# Patient Record
Sex: Female | Born: 1950 | Race: White | Hispanic: No | Smoking: Former smoker
Health system: Southern US, Community
[De-identification: ages and names within clinical notes are randomized; demographics above are authoritative.]

## PROBLEM LIST (undated history)

## (undated) DIAGNOSIS — F32A Depression, unspecified: Secondary | ICD-10-CM

## (undated) DIAGNOSIS — F329 Major depressive disorder, single episode, unspecified: Secondary | ICD-10-CM

## (undated) DIAGNOSIS — Z8711 Personal history of peptic ulcer disease: Secondary | ICD-10-CM

## (undated) DIAGNOSIS — I2510 Atherosclerotic heart disease of native coronary artery without angina pectoris: Secondary | ICD-10-CM

## (undated) DIAGNOSIS — J189 Pneumonia, unspecified organism: Secondary | ICD-10-CM

## (undated) DIAGNOSIS — I10 Essential (primary) hypertension: Secondary | ICD-10-CM

## (undated) DIAGNOSIS — I82409 Acute embolism and thrombosis of unspecified deep veins of unspecified lower extremity: Secondary | ICD-10-CM

## (undated) DIAGNOSIS — G43909 Migraine, unspecified, not intractable, without status migrainosus: Secondary | ICD-10-CM

## (undated) DIAGNOSIS — I82419 Acute embolism and thrombosis of unspecified femoral vein: Secondary | ICD-10-CM

## (undated) DIAGNOSIS — E1151 Type 2 diabetes mellitus with diabetic peripheral angiopathy without gangrene: Secondary | ICD-10-CM

## (undated) DIAGNOSIS — Z8719 Personal history of other diseases of the digestive system: Secondary | ICD-10-CM

## (undated) DIAGNOSIS — E119 Type 2 diabetes mellitus without complications: Secondary | ICD-10-CM

## (undated) DIAGNOSIS — K219 Gastro-esophageal reflux disease without esophagitis: Secondary | ICD-10-CM

## (undated) DIAGNOSIS — I2699 Other pulmonary embolism without acute cor pulmonale: Secondary | ICD-10-CM

## (undated) DIAGNOSIS — J45909 Unspecified asthma, uncomplicated: Secondary | ICD-10-CM

## (undated) DIAGNOSIS — Z9981 Dependence on supplemental oxygen: Secondary | ICD-10-CM

## (undated) DIAGNOSIS — E7800 Pure hypercholesterolemia, unspecified: Secondary | ICD-10-CM

## (undated) HISTORY — PX: TOTAL ABDOMINAL HYSTERECTOMY: SHX209

## (undated) HISTORY — PX: BACK SURGERY: SHX140

## (undated) HISTORY — PX: LUMBAR DISC SURGERY: SHX700

## (undated) HISTORY — PX: TUBAL LIGATION: SHX77

## (undated) HISTORY — PX: ESOPHAGOGASTRODUODENOSCOPY (EGD) WITH ESOPHAGEAL DILATION: SHX5812

## (undated) HISTORY — PX: APPENDECTOMY: SHX54

## (undated) HISTORY — PX: LAPAROSCOPIC CHOLECYSTECTOMY: SUR755

## (undated) HISTORY — PX: DILATION AND CURETTAGE OF UTERUS: SHX78

## (undated) HISTORY — PX: TONSILLECTOMY: SUR1361

---

## 2008-09-30 HISTORY — PX: HIATAL HERNIA REPAIR: SHX195

## 2011-06-06 DIAGNOSIS — Z794 Long term (current) use of insulin: Secondary | ICD-10-CM

## 2011-06-06 DIAGNOSIS — E1149 Type 2 diabetes mellitus with other diabetic neurological complication: Secondary | ICD-10-CM | POA: Insufficient documentation

## 2011-06-06 DIAGNOSIS — E039 Hypothyroidism, unspecified: Secondary | ICD-10-CM | POA: Insufficient documentation

## 2011-06-06 DIAGNOSIS — I10 Essential (primary) hypertension: Secondary | ICD-10-CM | POA: Insufficient documentation

## 2012-07-09 DIAGNOSIS — G609 Hereditary and idiopathic neuropathy, unspecified: Secondary | ICD-10-CM | POA: Insufficient documentation

## 2012-07-22 DIAGNOSIS — M4310 Spondylolisthesis, site unspecified: Secondary | ICD-10-CM | POA: Insufficient documentation

## 2012-08-25 DIAGNOSIS — M48061 Spinal stenosis, lumbar region without neurogenic claudication: Secondary | ICD-10-CM | POA: Insufficient documentation

## 2013-02-28 DIAGNOSIS — Z86711 Personal history of pulmonary embolism: Secondary | ICD-10-CM | POA: Insufficient documentation

## 2013-03-03 DIAGNOSIS — I82502 Chronic embolism and thrombosis of unspecified deep veins of left lower extremity: Secondary | ICD-10-CM | POA: Insufficient documentation

## 2015-02-05 ENCOUNTER — Encounter (HOSPITAL_COMMUNITY): Payer: Self-pay

## 2015-02-05 ENCOUNTER — Emergency Department (HOSPITAL_COMMUNITY)
Admission: EM | Admit: 2015-02-05 | Discharge: 2015-02-05 | Disposition: A | Discharge status: Home / Self Care | Payer: BLUE CROSS/BLUE SHIELD | Source: Home / Self Care | Attending: Family Medicine | Admitting: Family Medicine

## 2015-02-05 ENCOUNTER — Emergency Department (INDEPENDENT_AMBULATORY_CARE_PROVIDER_SITE_OTHER): Payer: BLUE CROSS/BLUE SHIELD

## 2015-02-05 DIAGNOSIS — J45909 Unspecified asthma, uncomplicated: Secondary | ICD-10-CM

## 2015-02-05 MED ORDER — IPRATROPIUM BROMIDE 0.06 % NA SOLN: 2.0000 | Freq: Four times a day (QID) | NASAL | Status: DC

## 2015-02-05 MED ORDER — HYDROCOD POLST-CPM POLST ER 10-8 MG/5ML PO SUER: 5.0000 mL | Freq: Two times a day (BID) | ORAL | Status: DC | PRN

## 2015-02-05 MED ORDER — METHYLPREDNISOLONE ACETATE 40 MG/ML IJ SUSP
80.0000 mg | Freq: Once | INTRAMUSCULAR | Status: AC
  Administered 2015-02-05: 80 mg via INTRAMUSCULAR

## 2015-02-05 MED ORDER — IPRATROPIUM BROMIDE 0.02 % IN SOLN
RESPIRATORY_TRACT | Status: AC
  Filled 2015-02-05: qty 2.5

## 2015-02-05 MED ORDER — METHYLPREDNISOLONE ACETATE 80 MG/ML IJ SUSP
INTRAMUSCULAR | Status: AC
  Filled 2015-02-05: qty 1

## 2015-02-05 MED ORDER — ALBUTEROL SULFATE (2.5 MG/3ML) 0.083% IN NEBU
5.0000 mg | INHALATION_SOLUTION | Freq: Once | RESPIRATORY_TRACT | Status: AC
  Administered 2015-02-05: 5 mg via RESPIRATORY_TRACT

## 2015-02-05 MED ORDER — ALBUTEROL SULFATE (2.5 MG/3ML) 0.083% IN NEBU
INHALATION_SOLUTION | RESPIRATORY_TRACT | Status: AC
  Filled 2015-02-05: qty 6

## 2015-02-05 MED ORDER — IPRATROPIUM BROMIDE 0.02 % IN SOLN
0.5000 mg | Freq: Once | RESPIRATORY_TRACT | Status: AC
  Administered 2015-02-05: 0.5 mg via RESPIRATORY_TRACT

## 2015-02-05 NOTE — ED Provider Notes (Signed)
CSN: [id number]     Arrival date & time 02/05/15  0905 History   First MD Initiated Contact with Patient 02/05/15 [phone]     Chief Complaint  Patient presents with  . Cough   (Consider location/radiation/quality/duration/timing/severity/associated sxs/prior Treatment) Patient is a 64 y.o. female presenting with cough. The history is provided by the patient.  Cough Cough characteristics:  Productive Sputum characteristics:  Yellow Severity:  Mild Onset quality:  Gradual Duration:  2 days Progression:  Unchanged Chronicity:  New Smoker: no   Context: exposure to allergens and weather changes   Ineffective treatments:  Cough suppressants Associated symptoms: rhinorrhea, sinus congestion and wheezing   Associated symptoms: no chills, no fever, no rash and no shortness of breath     History reviewed. No pertinent past medical history. History reviewed. No pertinent past surgical history. History reviewed. No pertinent family history. History  Substance Use Topics  . Smoking status: Never Smoker   . Smokeless tobacco: Not on file  . Alcohol Use: No   OB History    No data available     Review of Systems  Constitutional: Negative.  Negative for fever and chills.  HENT: Positive for congestion, postnasal drip and rhinorrhea.   Respiratory: Positive for cough and wheezing. Negative for shortness of breath.   Skin: Negative for rash.    Allergies  Review of patient's allergies indicates no known allergies.  Home Medications   Prior to Admission medications   Medication Sig Start Date End Date Taking? Authorizing Provider  insulin aspart (NOVOLOG) 100 UNIT/ML injection Inject 14 Units into the skin once.   Yes Historical Provider, MD  pregabalin (LYRICA) 75 MG capsule Take 75 mg by mouth 2 (two) times daily.   Yes Historical Provider, MD  chlorpheniramine-HYDROcodone (TUSSIONEX PENNKINETIC ER) 10-8 MG/5ML SUER Take 5 mLs by mouth every 12 (twelve) hours as needed for cough.  02/05/15   Linna HoffJames D Kindl, MD  ipratropium (ATROVENT) 0.06 % nasal spray Place 2 sprays into both nostrils 4 (four) times daily. 02/05/15   Linna HoffJames D Kindl, MD   BP 102/65 mmHg  Pulse 99  Temp(Src) 98.3 F (36.8 C) (Oral)  Resp 18  SpO2 95% Physical Exam  Constitutional: She is oriented to person, place, and time. She appears well-developed and well-nourished.  HENT:  Mouth/Throat: Oropharynx is clear and moist.  Neck: Normal range of motion. Neck supple.  Cardiovascular: Normal rate, regular rhythm and normal heart sounds.   Pulmonary/Chest: Effort normal. She has no decreased breath sounds. She has wheezes in the left lower field. She has rales in the left lower field.    Musculoskeletal: She exhibits no edema.  Neurological: She is alert and oriented to person, place, and time.  Skin: Skin is warm and dry.  Nursing note and vitals reviewed.   ED Course  Procedures (including critical care time) Labs Review Labs Reviewed - No data to display  Imaging Review Dg Chest 2 View  02/05/2015   CLINICAL DATA:  Cough for 3 days.  Left lower lobe rales on exam.  EXAM: CHEST  2 VIEW  COMPARISON:  None.  FINDINGS: Cardiopericardial silhouette is mildly enlarged. No mediastinal or hilar masses or evidence of adenopathy.  Lateral chest radiograph mildly degraded by motion. Allowing for this, the lungs are clear. No pleural effusion or pneumothorax.  Bony thorax is intact. There has been a previous anterior cervical spine fusion.  IMPRESSION: No active cardiopulmonary disease.   Electronically Signed   By: Amie Portlandavid  Ormond  M.D.   On: 02/05/2015 10:28   X-rays reviewed and report per radiologist.   MDM   1. Asthmatic bronchitis without complication    Sx improved, lungs clear after neb.    Linna HoffJames D Kindl, MD 02/05/15 1115

## 2015-02-05 NOTE — ED Notes (Signed)
Visiting. Cough since Friday w hoarseness, yellow secretions

## 2015-07-26 DIAGNOSIS — Z7901 Long term (current) use of anticoagulants: Secondary | ICD-10-CM | POA: Insufficient documentation

## 2015-07-26 DIAGNOSIS — E785 Hyperlipidemia, unspecified: Secondary | ICD-10-CM | POA: Insufficient documentation

## 2015-08-08 DIAGNOSIS — M5126 Other intervertebral disc displacement, lumbar region: Secondary | ICD-10-CM | POA: Insufficient documentation

## 2015-10-01 DIAGNOSIS — J189 Pneumonia, unspecified organism: Secondary | ICD-10-CM

## 2015-10-01 HISTORY — PX: LUNG BIOPSY: SHX232

## 2015-10-01 HISTORY — DX: Pneumonia, unspecified organism: J18.9

## 2015-10-20 DIAGNOSIS — J9601 Acute respiratory failure with hypoxia: Secondary | ICD-10-CM | POA: Insufficient documentation

## 2015-10-20 DIAGNOSIS — F32A Depression, unspecified: Secondary | ICD-10-CM | POA: Insufficient documentation

## 2015-10-20 DIAGNOSIS — F329 Major depressive disorder, single episode, unspecified: Secondary | ICD-10-CM | POA: Insufficient documentation

## 2015-10-20 DIAGNOSIS — J811 Chronic pulmonary edema: Secondary | ICD-10-CM | POA: Insufficient documentation

## 2015-10-20 DIAGNOSIS — D509 Iron deficiency anemia, unspecified: Secondary | ICD-10-CM | POA: Insufficient documentation

## 2015-10-20 DIAGNOSIS — K219 Gastro-esophageal reflux disease without esophagitis: Secondary | ICD-10-CM | POA: Insufficient documentation

## 2015-10-20 DIAGNOSIS — F419 Anxiety disorder, unspecified: Secondary | ICD-10-CM

## 2015-12-26 DIAGNOSIS — J9601 Acute respiratory failure with hypoxia: Secondary | ICD-10-CM | POA: Insufficient documentation

## 2015-12-26 DIAGNOSIS — T402X1A Poisoning by other opioids, accidental (unintentional), initial encounter: Secondary | ICD-10-CM | POA: Insufficient documentation

## 2016-06-18 DIAGNOSIS — J84111 Idiopathic interstitial pneumonia, not otherwise specified: Secondary | ICD-10-CM | POA: Insufficient documentation

## 2016-11-05 ENCOUNTER — Ambulatory Visit (INDEPENDENT_AMBULATORY_CARE_PROVIDER_SITE_OTHER): Payer: Medicare Other | Admitting: Pulmonary Disease

## 2016-11-05 ENCOUNTER — Encounter (INDEPENDENT_AMBULATORY_CARE_PROVIDER_SITE_OTHER): Payer: Self-pay

## 2016-11-05 ENCOUNTER — Other Ambulatory Visit (INDEPENDENT_AMBULATORY_CARE_PROVIDER_SITE_OTHER): Payer: Medicare Other

## 2016-11-05 ENCOUNTER — Encounter: Payer: Self-pay | Admitting: Pulmonary Disease

## 2016-11-05 VITALS — BP 102/60 | HR 108 | Ht 60.0 in | Wt 171.8 lb

## 2016-11-05 DIAGNOSIS — R0602 Shortness of breath: Secondary | ICD-10-CM

## 2016-11-05 DIAGNOSIS — J849 Interstitial pulmonary disease, unspecified: Secondary | ICD-10-CM

## 2016-11-05 DIAGNOSIS — R0902 Hypoxemia: Secondary | ICD-10-CM | POA: Diagnosis not present

## 2016-11-05 DIAGNOSIS — G4710 Hypersomnia, unspecified: Secondary | ICD-10-CM | POA: Diagnosis not present

## 2016-11-05 LAB — CBC
HCT: 43.9 % (ref 36.0–46.0)
HEMOGLOBIN: 14.3 g/dL (ref 12.0–15.0)
MCHC: 32.5 g/dL (ref 30.0–36.0)
MCV: 84.3 fl (ref 78.0–100.0)
PLATELETS: 360 10*3/uL (ref 150.0–400.0)
RBC: 5.2 Mil/uL — AB (ref 3.87–5.11)
RDW: 16.5 % — ABNORMAL HIGH (ref 11.5–15.5)
WBC: 8.9 10*3/uL (ref 4.0–10.5)

## 2016-11-05 LAB — COMPLETE METABOLIC PANEL WITH GFR
ALK PHOS: 90 U/L (ref 33–130)
ALT: 15 U/L (ref 6–29)
AST: 16 U/L (ref 10–35)
Albumin: 3.7 g/dL (ref 3.6–5.1)
BUN: 39 mg/dL — ABNORMAL HIGH (ref 7–25)
CO2: 23 mmol/L (ref 20–31)
CREATININE: 1.18 mg/dL — AB (ref 0.50–0.99)
Calcium: 9.4 mg/dL (ref 8.6–10.4)
Chloride: 96 mmol/L — ABNORMAL LOW (ref 98–110)
GFR, EST AFRICAN AMERICAN: 56 mL/min — AB (ref >=60)
GFR, Est Non African American: 49 mL/min — ABNORMAL LOW (ref >=60)
Glucose, Bld: 430 mg/dL — ABNORMAL HIGH (ref 65–99)
Potassium: 5.2 mmol/L (ref 3.5–5.3)
Sodium: 138 mmol/L (ref 135–146)
Total Bilirubin: 0.4 mg/dL (ref 0.2–1.2)
Total Protein: 7.3 g/dL (ref 6.1–8.1)

## 2016-11-05 NOTE — Patient Instructions (Signed)
It was a pleasure taking care of you today!  Continue using your  oxygen as you have been. Continue your  current medicines for your reflux and cough.  We will do blood work today as well as chest x-ray. We will schedule you for a breathing test, blood tests, lung scan, ultrasound of your  heart, and sleep study.  Return to clinic in 6 weeks with Dr. Christene Slatese Dios

## 2016-11-05 NOTE — Progress Notes (Signed)
Subjective:    Patient ID: Peggy Todd, female    DOB: 1951-02-03, 66 y.o.   MRN: [id number]  HPI   This is the case of Peggy Todd, 66 y.o. Female, who made this appoointment for cough. She was living and getting her care at [facility] but her daughter decided to bring her to [place] where she lives.  Daughter wanted to help her mother get better.     Patient has a 20 PY smoking history, quit in her 90s. She was diagnosed with asthma, adult onset.  Triggers : pollen. She was on singulair but was discontinued. Uses proair daily. Asthma has been stable. She did NOT have any significant medical issues until 2017.   She was admitted in January 2017 for bronchitis/PNA episode.  Unusually, she ended up being discharged with 3L O2 24/7. She presented with cough, SOB, wheezing.  Symptoms of infection persisted except for the cough and SOB.   She followed up with her pulmonologist at [facility], Dr. Basilio Cairo from [facility].  Allegedly, her CXR and chest CT scan have all been (-).  Her last chest CT scan was 09/2016.  Her oxygen requirement slowly increased throughout 2017 until now.  Currently, she is on 5-8 L O2 continuously.   Her SOB also slowly increased as her O2 needs increased.   She was given Cellcept 1500 BID which she ended up taking for a year with no significant change per patient. She was also given prednisone (as high as 40 mg for several weeks) with no significant change. She has since been on Prednisone 10 mg daily.  She is currently on generic Dymista 2 squirts/nostril BID to help with cough.  She takes Protonix in am.   She ended up going to [facility] in 02/2016.  She was seen by Dr. Wynn Maudlin. She had bronchoscopy with biposy in August 2017.  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  Patent had a provoked DVT in her LLL in 2014 for which she was on blood thinner for a year.   Per pt and daughter, no  extensive cardiac work up has been done the last year.   Reviewing records from [facility], patient's initial presentation was 10/2015 for walking PNA. She had a surgical lung biopsy in March 2017 which showed nonspecific findings of honeycombing, organizing pneumonia, rare granulomas, and a lymphoplasmacytic infiltrate.  She was treated with prednisone, initially from 60 mg/d, cutting down to 20 mg/d, eventually MMF 500 mg BID was added.  Her O2 requirement increased as well as her SOB.   FVC was 49%, DLCO was 41%.  CT scan was nonspecific with patchy ground glass. ESR was 23, CRP 1.1. Serologies were negative. There was concern for microaspiration causing her pulmonary issues to get worse. Swallow study was unremarkable.    Review of Systems  Constitutional: Negative.  Negative for fever and unexpected weight change.  HENT: Positive for congestion and ear pain. Negative for dental problem, nosebleeds, postnasal drip, rhinorrhea, sinus pressure, sneezing, sore throat and trouble swallowing.   Eyes: Negative.  Negative for redness and itching.  Respiratory: Positive for cough and shortness of breath. Negative for chest tightness and wheezing.   Cardiovascular: Negative.  Negative for palpitations and leg swelling.  Gastrointestinal: Negative.  Negative for nausea and vomiting.  Endocrine: Negative.   Genitourinary: Negative.  Negative for dysuria.  Musculoskeletal: Negative.  Negative for joint swelling.  Skin: Negative.  Negative for  rash.  Allergic/Immunologic: Positive for environmental allergies.  Neurological: Negative.  Negative for headaches.  Hematological: Bruises/bleeds easily.  Psychiatric/Behavioral: Negative.  Negative for dysphoric mood. The patient is not nervous/anxious.    No past medical history on file.  No heart issues, (-) CA. Had DVT in LLE in 2014, ended up in lungs. Looks like provoked. Was on blood thinners.   No family history on file.  Mother had DM, CHF Father had CAD.    No past surgical history on file.  Had neck surgery S/P GB removal 2008 S/P hysterectomy 1994 S/P appendectomy 20 yrs ago Esophagus stretched in 2018.   Social History   Social History  . Marital status: Single    Spouse name: N/A  . Number of children: N/A  . Years of education: N/A   Occupational History  . Not on file.   Social History Main Topics  . Smoking status: Former Research scientist (life sciences)  . Smokeless tobacco: Never Used  . Alcohol use No  . Drug use: Unknown  . Sexual activity: Not on file   Other Topics Concern  . Not on file   Social History Narrative  . No narrative on file   widow, with 2 children. Lives in [place]. Daughter works in [employer]. (-) ETOH.   Allergies  Allergen Reactions  . Penicillins Swelling  . Codeine Other (See Comments)    INSOMNIA, m "makes me hyper"  . Atorvastatin Other (See Comments)    Generalized myalgia     Outpatient Medications Prior to Visit  Medication Sig Dispense Refill  . chlorpheniramine-HYDROcodone (TUSSIONEX PENNKINETIC ER) 10-8 MG/5ML SUER Take 5 mLs by mouth every 12 (twelve) hours as needed for cough. 140 mL 1  . insulin aspart (NOVOLOG) 100 UNIT/ML injection Inject 14 Units into the skin once.    Marland Kitchen ipratropium (ATROVENT) 0.06 % nasal spray Place 2 sprays into both nostrils 4 (four) times daily. 15 mL 1  . pregabalin (LYRICA) 75 MG capsule Take 75 mg by mouth 2 (two) times daily.     No facility-administered medications prior to visit.    Meds ordered this encounter  Medications  . furosemide (LASIX) 20 MG tablet    Sig: 20 mg.  . dapagliflozin propanediol (FARXIGA) 5 MG TABS tablet    Sig: Take 5 mg by mouth daily.  . Azelastine-Fluticasone 137-50 MCG/ACT SUSP    Sig: by Nasal route daily.  . simvastatin (ZOCOR) 40 MG tablet    Sig: Take 1 tablet (40 mg total) by mouth nightly.  Marland Kitchen amLODipine (NORVASC) 10 MG tablet    Sig: Take 10 mg by mouth daily.  . pioglitazone (ACTOS) 15 MG tablet    Sig: Take 15 mg by  mouth every other day.  . predniSONE (DELTASONE) 10 MG tablet    Sig: Take 10 mg by mouth daily with breakfast.  . hydrochlorothiazide (HYDRODIURIL) 25 MG tablet    Sig: Take 25 mg by mouth daily.  Marland Kitchen FLUoxetine (PROZAC) 20 MG capsule    Sig: Take 1 capsule by mouth daily.  . pregabalin (LYRICA) 75 MG capsule    Sig: Take 2 capsules by mouth 2 (two) times daily.   . insulin aspart (NOVOLOG) 100 UNIT/ML injection    Sig: Inject 4 Units into the skin 3 (three) times daily before meals. Sliding scale coverage  . insulin detemir (LEVEMIR) 100 UNIT/ML injection    Sig: Inject into the skin at bedtime.  . OXYGEN    Sig: 3 L.  Marland Kitchen albuterol (  PROAIR HFA) 108 (90 Base) MCG/ACT inhaler    Sig: Inhale 2 puffs into the lungs 4 (four) times daily as needed.        Objective:   Physical Exam Vitals:  Vitals:   11/05/16 1452  BP: 102/60  Pulse: (!) 108  SpO2: 95%  Weight: 171 lb 12.8 oz (77.9 kg)  Height: 5' (1.524 m)  On 5L on pulsed o2.   Constitutional/General:  Pleasant, well-nourished, well-developed, not in any distress,  Comfortably seating.  Well kempt  Body mass index is 33.55 kg/m. Wt Readings from Last 3 Encounters:  11/05/16 171 lb 12.8 oz (77.9 kg)    HEENT: Pupils equal and reactive to light and accommodation. Anicteric sclerae. Normal nasal mucosa.   No oral  lesions,  mouth clear,  oropharynx clear, no postnasal drip. (-) Oral thrush. No dental caries.  Airway - Mallampati class III  Neck: No masses. Midline trachea. No JVD, (-) LAD. (-) bruits appreciated.  Respiratory/Chest: Grossly normal chest. (-) deformity. (-) Accessory muscle use.  Symmetric expansion. (-) Tenderness on palpation.  Resonant on percussion.  Diminished BS on both lower lung zones. (-) wheezing, rhonchi. Scattered crackles mid-bases.  (-) egophony  Cardiovascular: Regular rate and  rhythm, heart sounds normal, no murmur or gallops, no peripheral edema  Gastrointestinal:  Normal bowel  sounds. Soft, non-tender. No hepatosplenomegaly.  (-) masses.   Musculoskeletal:  Normal muscle tone. Normal gait.   Extremities: Grossly normal. (-) clubbing, cyanosis.  (-) edema  Skin: (-) rash,lesions seen.   Neurological/Psychiatric : alert, oriented to time, place, person. Normal mood and affect         Assessment & Plan:  ILD (interstitial lung disease) ([place]) Patient is here to obtain care for her ILD as her daughter has decided to take care of her mother. Pt was getting her care from [facility] in 2017 c/o Dr. Basilio Cairo as well as from [facility] since 02/2016 c/o Dr. Wynn Maudlin. Daughter lives in [place].   No significant smoking history. She had DVT in 2013 on blood thinners x 1 year.  She had a walking PNA in 10/2015.  She was discharged on O2 3L 24/7.  Her lung infiltrates were persistent.  She had a surgical lung biopsy which showed non specific findings of honeycombing, organizing pna, rare granulomas, and a lymphoplasmacytic infiltrate.  She was started on Prednisone and Cellcept was added to be able to taper down the Prednisone.   She went to [facility] for further management.   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 Rare granulomas.  Comment: The histological changes are non-specific and may represent advanced usual interstitial pneumonia or may be a result of previous/ongiong connective tissue disease, aspiration, hypersensitivity, or infection. Clinical correlation and radiographic studies required to further characterize the process underlying this chronic interstitial fibrosis.   She followed up with both [facility] and [facility].   Pt's O2 requirement increased as well as her SOB.   Cellcept was increased to 1500 mg BID, Prednisone tapered down to 10 mg/day, and Bactrim was added as PCP prophylaxis.   Patient had a swallow evaln which was  (-). Per [facility] records, CTD work up/serologies were all (-). She was started on Pulmonary rehab.   Assessment : 1. Interstitial Lung disease A. 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 Serologies are (-). No significant exposures.   2. Aspiration component considered. EGD (08/2016) with pharyngeo-esophageal abn/dismotility.   3. DVT in 2013. Allegedly on Xarelto. CTA (-) 03/2016 for filling defects.   4. Obesity  5. Deconditioning   Plan : 1.  I think patient and daughter are in a stage of some denial as pt has slowly declined the last year.  She brought her mother to [place] to be able to take care of her.  I mentioned to pt and daughter that I will review all her records and explain to them once I have all her information. Pt was also not sure about her meds.  She was not sure whether she was taking Xarelto or Bactrim. She stopped taking cellcept as it was not making a difference after 1 yr of taking it.  2. As far as meds are concerned, I told them to continue with prednisone at 10 mg/d.  She did NOT want to resume Cellcept.  They need to determine whether she was on Xarelto and Bactrim 3.  As I anticipate will be in [place] and will have frequent admissions, will obtain CBC, CMP, baseline CXR, Will get PFT and ABG. 4. As it was not clear whether pt was taking xarelto, certainly chronic PE is a consideration but less likely.  Will obtain a VQ scan.  5. Will obtain baseline 2Decho.  No extensive cardiac work up has been done. 6.  It is also possible that she has OSA or significant hypoxemia at night.  She has hypersomnia, fatigue, snoring.  Will obtain a sleep study on her current O2 requirement.  7. Certainly, she can have microaspiration making things worse.  I told pt to take her  Protonix at HS.  8. She will need to be back on Pulmonary Rehab.  9.  I need to verify her vaccine status on f/u.  She got the influenza vaccine I think.  Not sure about PNA vaccines.        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 Currently on 5-8 L O2 [place] 24/7.  On exertion, she sometimes uses pulsed O2. Pt has a pulse oximeter.      Thank you very much for letting me participate in this patient's care. Please do not hesitate to give me a call if you have any questions or concerns regarding the treatment plan.   Patient will follow up with me in 4-6 weeks.     Monica Becton, MD 11/06/2016   4:46 AM [facility] [place] Pager: [phone] Office: [phone], Fax: [phone]

## 2016-11-06 ENCOUNTER — Telehealth: Payer: Self-pay | Admitting: Pulmonary Disease

## 2016-11-06 DIAGNOSIS — R0902 Hypoxemia: Secondary | ICD-10-CM | POA: Insufficient documentation

## 2016-11-06 DIAGNOSIS — J849 Interstitial pulmonary disease, unspecified: Secondary | ICD-10-CM | POA: Insufficient documentation

## 2016-11-06 LAB — IGG: IgG (Immunoglobin G), Serum: 1250 mg/dL (ref 694–1618)

## 2016-11-06 LAB — IGE: IGE (IMMUNOGLOBULIN E), SERUM: 3 kU/L (ref <115)

## 2016-11-06 LAB — IGM: IGM, SERUM: 63 mg/dL (ref 48–271)

## 2016-11-06 NOTE — Assessment & Plan Note (Addendum)
Patient is here to obtain care for her ILD as her daughter has decided to take care of her mother. Pt was getting [facility] in 2017 c/o Dr. Donavan Burnetouglas Lee as well as [facility] since 02/2016 c/o Dr. Marcelene [place]. Daughter lives in [place].   No significant smoking history. She had DVT in 2013 on blood thinners x 1 year.  She had a walking PNA in 10/2015.  She was discharged on O2 3L 24/7.  Her lung infiltrates were persistent.  She had a surgical lung biopsy which showed non specific findings of honeycombing, organizing pna, rare granulomas, and a lymphoplasmacytic infiltrate.  She was started on Prednisone and Cellcept was added to be able to taper down the Prednisone.   She [facility] to [facility] management.   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 Rare granulomas.  Comment: The histological changes are non-specific and may represent advanced usual interstitial pneumonia or may be a result of previous/ongiong connective tissue disease, aspiration, hypersensitivity, or infection. Clinical correlation and radiographic studies required to further characterize the process underlying this chronic interstitial fibrosis.   She [facility].   Pt's O2 requirement increased as well as her SOB.   Cellcept was increased to 1500 mg BID, Prednisone tapered down to 10 mg/day, and Bactrim was added as PCP prophylaxis.   Patient had a swallow evaln which was (-). Per [facility] records, CTD work up/serologies were all (-). She was started on Pulmonary rehab.   Assessment : 1. Interstitial Lung disease A. 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 CT scan images c/w NSIP vs chronic  HP (03/2016) C. Serologies are (-). No significant exposures.   2. Aspiration component considered. EGD (08/2016) with pharyngeo-esophageal abn/dismotility.   3. DVT in 2013. Allegedly on Xarelto. CTA (-) 03/2016 for filling defects.   4. Obesity  5. Deconditioning   Plan : 1.  I think patient and daughter are in a stage of some denial as pt has slowly declined the last year.  She brought her mother to [place] to be able to take care of her.  I mentioned to pt and daughter that I will review all her records and explain to them once I have all her information. Pt was also not sure about her meds.  She was not sure whether she was taking Xarelto or Bactrim. She stopped taking cellcept as it was not making a difference after 1 yr of taking it.  2. As far as meds are concerned, I told them to continue with prednisone at 10 mg/d.  She did NOT want to resume Cellcept.  They need to determine whether she was on Xarelto and Bactrim 3.  As I anticipate will be in [place] and will have frequent admissions, will obtain CBC, CMP, baseline CXR, Will get PFT and ABG. 4. As it was not clear whether pt was taking xarelto, certainly chronic PE is a consideration but less likely.  Will obtain a VQ scan.  5. Will obtain baseline 2Decho.  No extensive cardiac work up has been done. 6.  It is also possible that she has OSA or significant hypoxemia at night.  She has hypersomnia, fatigue, snoring.  Will obtain a sleep study on her current O2 requirement.  7. Certainly,  she can have microaspiration making things worse.  I told pt to take her Protonix at HS.  8. She will need to be back on Pulmonary Rehab.  9.  I need to verify her vaccine status on f/u.  She got the influenza vaccine I think.  Not sure about PNA vaccines.

## 2016-11-06 NOTE — Telephone Encounter (Signed)
AD  I spoke with Peggy Todd, She stated she is not taking the xarelto or bactrim. She stated she had a flu shot Aug. 2017 and she had the pna 23 but does not think she had the prevnar 13. She states she sometimes using her oxygen at night, she says she uses 4L.   Her VQ and 2D Echo is scheduled for 2/19 Her PFT and ABG and OV is 12/18/16

## 2016-11-06 NOTE — Telephone Encounter (Signed)
Pt returning call and can be reached [phone].Peggy Todd

## 2016-11-06 NOTE — Telephone Encounter (Signed)
   Jasmine :  1. Can you pls call and ask pt/daughter whether she was taking Xarelto daily as well as Bactrim daily.  She did not remember when I saw her on 2/5. pls ask also if she got Flu vaccine as well as PNA 23 vaccine and Prevnar 13 vaccine.  2.  Can we obtain images of chest [facility]? As well as other chest ct scans they have. Regular mail of CD of chest ct scan will do.  3.  Can we pls do the split night sleep study on whatever O2 she sleeps with.  I think she sleeps with 5 L o2.  Pls ask pt.  Do NOT do sleep study on RA.  4.  Can I see her as soon as VQ scan and echo and PFT/ABG are done? my earliest opening. She can even be an add on.   Thanks !  J. Alexis FrockAngelo A de Dios, MD 11/06/2016, 4:52 AM [place] Pager [phone] After 3 pm or if no answer, call [phone]

## 2016-11-06 NOTE — Assessment & Plan Note (Signed)
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 Currently on 5-8 L O2 [place] 24/7.  On exertion, she sometimes uses pulsed O2. Pt has a pulse oximeter.

## 2016-11-11 ENCOUNTER — Other Ambulatory Visit: Payer: Self-pay

## 2016-11-11 DIAGNOSIS — G4710 Hypersomnia, unspecified: Secondary | ICD-10-CM

## 2016-11-13 ENCOUNTER — Ambulatory Visit (HOSPITAL_COMMUNITY): Payer: Medicare Other

## 2016-11-13 ENCOUNTER — Encounter (HOSPITAL_COMMUNITY): Payer: Medicare Other

## 2016-11-18 ENCOUNTER — Ambulatory Visit (HOSPITAL_COMMUNITY)
Admission: RE | Admit: 2016-11-18 | Discharge: 2016-11-18 | Disposition: A | Discharge status: Home / Self Care | Payer: Medicare Other | Source: Ambulatory Visit | Attending: Pulmonary Disease | Admitting: Pulmonary Disease

## 2016-11-18 ENCOUNTER — Ambulatory Visit (HOSPITAL_BASED_OUTPATIENT_CLINIC_OR_DEPARTMENT_OTHER)
Admission: RE | Admit: 2016-11-18 | Discharge: 2016-11-18 | Disposition: A | Discharge status: Home / Self Care | Payer: Medicare Other | Source: Ambulatory Visit | Attending: Pulmonary Disease | Admitting: Pulmonary Disease

## 2016-11-18 DIAGNOSIS — E785 Hyperlipidemia, unspecified: Secondary | ICD-10-CM | POA: Diagnosis not present

## 2016-11-18 DIAGNOSIS — R918 Other nonspecific abnormal finding of lung field: Secondary | ICD-10-CM | POA: Diagnosis not present

## 2016-11-18 DIAGNOSIS — R0602 Shortness of breath: Secondary | ICD-10-CM | POA: Diagnosis present

## 2016-11-18 DIAGNOSIS — I071 Rheumatic tricuspid insufficiency: Secondary | ICD-10-CM | POA: Diagnosis not present

## 2016-11-18 DIAGNOSIS — I10 Essential (primary) hypertension: Secondary | ICD-10-CM | POA: Diagnosis not present

## 2016-11-18 MED ORDER — TECHNETIUM TC 99M DIETHYLENETRIAME-PENTAACETIC ACID
32.7000 | Freq: Once | INTRAVENOUS | Status: AC | PRN
  Administered 2016-11-18: 32.7 via RESPIRATORY_TRACT

## 2016-11-18 MED ORDER — TECHNETIUM TO 99M ALBUMIN AGGREGATED
4.4000 | Freq: Once | INTRAVENOUS | Status: AC | PRN
  Administered 2016-11-18: 4.4 via INTRAVENOUS

## 2016-11-18 NOTE — Progress Notes (Signed)
  Echocardiogram 2D Echocardiogram has been performed.  Giulia Hickey L Androw 11/18/2016, 11:47 AM

## 2016-11-21 ENCOUNTER — Telehealth: Payer: Self-pay | Admitting: Pulmonary Disease

## 2016-11-21 NOTE — Telephone Encounter (Signed)
Attempted to contact pt to give her VQ scan and 2D echo results but was unable to leave a vm

## 2016-11-22 NOTE — Telephone Encounter (Signed)
AD  Spoke with pt and gave her, She stated she is not on a blood thinner, she use to be on Xarelto. She had two questions, the first one was were you thinking about placing her back on a blood thinner and two she is still coughing what do you recommend she do?

## 2016-11-26 ENCOUNTER — Telehealth: Payer: Self-pay | Admitting: Pulmonary Disease

## 2016-11-26 MED ORDER — MONTELUKAST SODIUM 10 MG PO TABS: 10.0000 mg | ORAL_TABLET | Freq: Every day | ORAL | 3 refills | Status: DC

## 2016-11-26 NOTE — Telephone Encounter (Signed)
Spoke with pt and verified that I had the correct pharmacy. I have sent in her rx per AD. She had no further questions. Nothing further is needed at this time.

## 2016-11-26 NOTE — Telephone Encounter (Signed)
   I spoke and updated pt.  Her cough is chronic, most likely related to her ILD + GERD + Allergies.  Pt is on chronic pred at 10 mg/d. She will restart her cell cept. Cont protonic, flonase. Restart singulair.   [place] back in [place].  I advised her to f/u with her PULM and PCP in [place].    Jasmine >> pls call in Singulair 10 mg/tab 1 tab at HS #30, 3 refills.   Pollie MeyerJ. Angelo A de Dios, MD 11/26/2016, 9:03 AM [facility] Pager [phone] After 3 pm or if no answer, call [phone]

## 2016-11-26 NOTE — Telephone Encounter (Signed)
Rec;d from [facility] forward 75 pages to Dr. Delphina CahillJose Angelo A deDios

## 2016-12-12 DIAGNOSIS — I2510 Atherosclerotic heart disease of native coronary artery without angina pectoris: Secondary | ICD-10-CM | POA: Insufficient documentation

## 2016-12-18 ENCOUNTER — Ambulatory Visit: Payer: Medicare Other | Admitting: Pulmonary Disease

## 2016-12-24 ENCOUNTER — Encounter (HOSPITAL_BASED_OUTPATIENT_CLINIC_OR_DEPARTMENT_OTHER): Payer: Medicare Other

## 2016-12-29 HISTORY — PX: CORONARY ANGIOPLASTY WITH STENT PLACEMENT: SHX49

## 2017-05-06 DIAGNOSIS — M5136 Other intervertebral disc degeneration, lumbar region: Secondary | ICD-10-CM | POA: Insufficient documentation

## 2017-06-27 ENCOUNTER — Encounter: Payer: Self-pay | Admitting: Internal Medicine

## 2017-06-27 ENCOUNTER — Ambulatory Visit (INDEPENDENT_AMBULATORY_CARE_PROVIDER_SITE_OTHER): Payer: Medicare Other | Admitting: Internal Medicine

## 2017-06-27 VITALS — BP 106/60 | Ht 60.0 in | Wt 158.0 lb

## 2017-06-27 DIAGNOSIS — J849 Interstitial pulmonary disease, unspecified: Secondary | ICD-10-CM | POA: Diagnosis not present

## 2017-06-27 DIAGNOSIS — R0609 Other forms of dyspnea: Secondary | ICD-10-CM

## 2017-06-27 DIAGNOSIS — J9611 Chronic respiratory failure with hypoxia: Secondary | ICD-10-CM | POA: Diagnosis not present

## 2017-06-27 DIAGNOSIS — R05 Cough: Secondary | ICD-10-CM

## 2017-06-27 DIAGNOSIS — I10 Essential (primary) hypertension: Secondary | ICD-10-CM

## 2017-06-27 DIAGNOSIS — R058 Other specified cough: Secondary | ICD-10-CM

## 2017-06-27 MED ORDER — AMLODIPINE BESYLATE 10 MG PO TABS: ORAL_TABLET | ORAL | Status: DC

## 2017-06-27 MED ORDER — TRAMADOL HCL 50 MG PO TABS: ORAL_TABLET | ORAL | 0 refills | Status: DC

## 2017-06-27 MED ORDER — PANTOPRAZOLE SODIUM 40 MG PO TBEC: DELAYED_RELEASE_TABLET | ORAL | 2 refills | Status: DC

## 2017-06-27 NOTE — Progress Notes (Signed)
Subjective:    Patient ID: Peggy Todd, female    DOB: [birth year], 66 y.o.   MRN: [id number]  HPI  Initial eval Peggy Todd  11/05/16 This is the case of Peggy Todd, [id number] remote smoker, who made this appoointment for cough. She was living and getting her care at [facility] but her daughter decided to bring her to [place] where she lives.       Patient has a 20 PY smoking history, quit in her 45s. She was diagnosed with asthma, adult onset.  Triggers : pollen. She was on singulair but was discontinued. Uses proair daily. Asthma has been stable.    Patent had a provoked DVT in her LLL in 2013 for which she was on blood thinner for a year.   She was admitted in January 2017 for bronchitis/PNA episode. >> discharged with 3L O2 24/7.  She ended up going to [facility] in 02/2016.  She was seen by Dr. Wynn Maudlin. She had bronchoscopy with biopsy in August 2017.  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 Reviewing records from [facility], patient's initial presentation was 10/2015 for walking PNA. She had a surgical lung biopsy in March 2017 which showed nonspecific findings of honeycombing, organizing pneumonia, rare granulomas, and a lymphoplasmacytic infiltrate.  She was treated with prednisone, initially from 60 mg/d, cutting down to 20 mg/d, eventually MMF 500 mg BID was added.  Her O2 requirement increased as well as her SOB.   FVC was 49%, DLCO was 41%.  CT scan was nonspecific with patchy ground glass. ESR was 23, CRP 1.1. Serologies were negative. There was concern for microaspiration causing her pulmonary issues to get worse. Swallow study was unremarkable.  Last ov 06/20/16 Dr Randol Kern rec Use oxygen when you walk. You need up to 8 LPM with exertion. I will send a new prescription to [place]. Increase cellcept to 1500 mg twice daily (3 tablets twice). Continue prednisone. I have added an antibiotic to prevent some - but not all - infections (bactrim)  F/u 3 mon > not  done    at ov with DeDios reported  She was given Cellcept 1500 BID which she ended up taking for a year with no significant change per patient. She was also given prednisone (as high as 40 mg for several weeks) with no significant change. She has since been on Prednisone 10 mg daily rec Continue using your  oxygen as you have been. Continue your  current medicines for your reflux and cough. Echo > diastolic dysfunction gr I , no PH  11/18/16  pfts rec/ not done          06/27/2017  f/u ov/Gwynne Kemnitz re:  resp failure of ? Etiology/ multiple centers of care/ confused with instructions per DeDios note Chief Complaint  Patient presents with  . Follow-up    Former patient of Dr DeDios. She c/o increased SOB and prod cough with brown sputum.    Some better in April 2018 p stents on / never <prednisone 10 since started and back on cell cept x one month> no better protonix no timing with meals At end of admit 06/21/17 in [place]  no better p admit >  Swallowing better since EGD/ dilation 06/19/17 (but study was nl) and on protonix but not timed ac  On high dose norvasc with desats on "10 liters" x across the room  Notes brown mucus "maybe once a week"    No obvious day to day  or daytime variability or assoc   mucus plugs or hemoptysis or cp or chest tightness, subjective wheeze or overt sinus or hb symptoms. No unusual exp hx or h/o childhood pna/ asthma or knowledge of premature birth.  Sleeping ok propped up on 2 pillows  without nocturnal  or early am exacerbation  of respiratory  c/o's or need for noct saba. Also denies any obvious fluctuation of symptoms with weather or environmental changes or other aggravating or alleviating factors except as outlined above   Current Allergies, Complete Past Medical History, Past Surgical History, Family History, and Social History were reviewed in [employer] record.  ROS  The following are not active complaints unless  bolded Hoarseness, sore throat, dysphagia, dental problems, itching, sneezing,  nasal congestion or discharge of excess mucus or purulent secretions, ear ache,   fever, chills, sweats, unintended wt loss or wt gain, classically pleuritic or exertional cp,  orthopnea pnd or leg swelling, presyncope, palpitations, abdominal pain, anorexia, nausea, vomiting, diarrhea  or change in bowel habits or change in bladder habits, change in stools or change in urine, dysuria, hematuria,  rash, arthralgias, visual complaints, headache, numbness, weakness or ataxia or problems with walking or coordination,  change in mood/affect or memory.        Current Meds - not certain as to accuacy   Medication Sig  . albuterol (PROAIR HFA) 108 (90 Base) MCG/ACT inhaler Inhale 2 puffs into the lungs 4 (four) times daily as needed.  Marland Kitchen amLODipine (NORVASC) 10 MG tablet One half daily  . azelastine (ASTELIN) 0.1 % nasal spray Place 1 spray into both nostrils 2 (two) times daily. Use in each nostril as directed  . clopidogrel (PLAVIX) 75 MG tablet Take 75 mg by mouth daily.  . Cyanocobalamin (B-12 PO) Take 1 tablet by mouth daily.  . dapagliflozin propanediol (FARXIGA) 5 MG TABS tablet Take 5 mg by mouth daily.  . furosemide (LASIX) 40 MG tablet Take 40 mg by mouth daily.  . insulin detemir (LEVEMIR) 100 UNIT/ML injection Inject into the skin at bedtime.  . magnesium citrate SOLN Take 10 mLs by mouth daily as needed for severe constipation.  . mycophenolate (CELLCEPT) 500 MG tablet Take 500 mg by mouth 2 (two) times daily.  . nitroGLYCERIN (NITROSTAT) 0.4 MG SL tablet Place 0.4 mg under the tongue every 5 (five) minutes as needed for chest pain.  . OXYGEN 8-10 lpm  . pantoprazole (PROTONIX) 40 MG tablet Take 30- 60 min before your first and last meals of the day  . pioglitazone (ACTOS) 15 MG tablet Take 15 mg by mouth every other day.  . polyethylene glycol (MIRALAX / GLYCOLAX) packet Take 17 g by mouth daily.  . predniSONE  (DELTASONE) 10 MG tablet Take 10 mg by mouth daily with breakfast.  . pregabalin (LYRICA) 100 MG capsule Take 100 mg by mouth daily.  . rosuvastatin (CRESTOR) 20 MG tablet Take 20 mg by mouth daily.  . sertraline (ZOLOFT) 25 MG tablet Take 25 mg by mouth daily.  . [  amLODipine (NORVASC) 10 MG tablet Take 10 mg by mouth daily.  . [  pantoprazole (PROTONIX) 40 MG tablet Take 40 mg by mouth daily.            Objective:   Physical Exam   W/c bound hoarse wf nad   Wt Readings from Last 3 Encounters:  06/27/17 158 lb (71.7 kg)  11/05/16 171 lb 12.8 oz (77.9 kg)    Vital signs  reviewed  - - Note on arrival 02 sats  89% on 6lpm    HEENT: nl dentition, turbinates bilaterally, and oropharynx. Nl external ear canals without cough reflex   NECK :  without JVD/Nodes/TM/ nl carotid upstrokes bilaterally   LUNGS: no acc muscle use,  Nl contour chest with diffuse insp crackles but no cough on inspiration   CV:  RRR  no s3 or murmur or increase in P2, and no edema   ABD:  soft and nontender with nl inspiratory excursion in the supine position. No bruits or organomegaly appreciated, bowel sounds nl  MS:  Nl gait/ ext warm without deformities, calf tenderness, cyanosis or clubbing No obvious joint restrictions   SKIN: warm and dry without lesions    NEURO:  alert, approp, nl sensorium with  no motor or cerebellar deficits apparent.      CXR PA and Lateral:   06/27/2017 :    I personally reviewed images and agree with radiology impression as follows:   Did not go to xray  Labs ordered 06/27/2017  Did not go to lab           Assessment & Plan:

## 2017-06-27 NOTE — Patient Instructions (Addendum)
Adjust 02 to keep it > 88% as the goal   Reduce the amlodipine 10 mg to one half daily (high doses may contribute to low 02)  Protonix 40 mg Take 30- 60 min before your first and last meals of the day   GERD (REFLUX)  is an extremely common cause of respiratory symptoms just like yours , many times with no obvious heartburn at all.    It can be treated with medication, but also with lifestyle changes including elevation of the head of your bed (ideally with 6 inch  bed blocks),  Smoking cessation, avoidance of late meals, excessive alcohol, and avoid fatty foods, chocolate, peppermint, colas, red wine, and acidic juices such as orange juice.  NO MINT OR MENTHOL PRODUCTS SO NO COUGH DROPS   USE SUGARLESS CANDY INSTEAD (Jolley ranchers or Stover's or Life Savers) or even ice chips will also do - the key is to swallow to prevent all throat clearing. NO OIL BASED VITAMINS - use powdered substitutes.    Take delsym two tsp every 12 hours and supplement if needed with  tramadol 50 mg up to 1- 2 every 4 hours to suppress the urge to cough. Swallowing water or using ice chips/non mint and menthol containing candies (such as lifesavers or sugarless jolly ranchers) are also effective.  You should rest your voice and avoid activities that you know make you cough.  Once you have eliminated the cough for 3 straight days try reducing the tramadol first,  then the delsym as tolerated.    Please remember to go to the lab and x-ray department downstairs in the basement  for your tests - we will call you with the results when they are available.     Please see patient coordinator before you leave today  to schedule humidity for your 02 and an Internal medicine evaluation  See [name] NP w/in 2 weeks with all your medications, even over the counter meds, separated in two separate bags, the ones you take no matter what vs the ones you stop once you feel better and take only as needed when you feel you need them.    [name]  will generate for you a new user friendly medication calendar that will put [place] all on the same page re: your medication use.     Without this process, it simply isn't possible to assure that we are providing  your outpatient care  with  the attention to detail we feel you deserve.   If we cannot assure that you're getting that kind of care,  then we cannot manage your problem effectively from this clinic.  Once you have seen [name] and we are sure that we're all on the same page with your medication use she will arrange follow up with Dr Marchelle Gearing   Late add: did not go to xray or lab as rec

## 2017-06-28 ENCOUNTER — Encounter: Payer: Self-pay | Admitting: Internal Medicine

## 2017-06-28 DIAGNOSIS — R058 Other specified cough: Secondary | ICD-10-CM | POA: Insufficient documentation

## 2017-06-28 DIAGNOSIS — R05 Cough: Secondary | ICD-10-CM | POA: Insufficient documentation

## 2017-06-28 NOTE — Assessment & Plan Note (Signed)
Upper airway cough syndrome (previously labeled PNDS),  is so named because it's frequently impossible to sort out how much is  CR/sinusitis with freq throat clearing (which can be related to primary GERD)   vs  causing  secondary (" extra esophageal")  GERD from wide swings in gastric pressure that occur with throat clearing, often  promoting self use of mint and menthol lozenges that reduce the lower esophageal sphincter tone and exacerbate the problem further in a cyclical fashion.   These are the same pts (now being labeled as having "irritable larynx syndrome" by some cough centers) who not infrequently have a history of having failed to tolerate ace inhibitors,  dry powder inhalers or biphosphonates or report having atypical/extraesophageal reflux symptoms that don't respond to standard doses of PPI  and are easily confused as having aecopd or asthma flares by even experienced allergists/ pulmonologists (myself included).   see avs for instructions unique to this ov    I had an extended discussion with the patient reviewing all relevant studies completed to date and  lasting 25 minutes of a 40  minute office visit with extremely complex pt new to me     re  severe non-specific but potentially very serious refractory respiratory symptoms of uncertain and potentially multiple  etiologies.   Each maintenance medication was reviewed in detail including most importantly the difference between maintenance and prns and under what circumstances the prns are to be triggered using an action plan format that is not reflected in the computer generated alphabetically organized AVS.    Please see AVS for specific instructions unique to this office visit that I personally wrote and verbalized to the the pt in detail and then reviewed with pt  by my nurse highlighting any changes in therapy/plan of care  recommended at today's visit.

## 2017-06-28 NOTE — Assessment & Plan Note (Signed)
Advised to titrate to keep sats > 88%

## 2017-06-28 NOTE — Assessment & Plan Note (Signed)
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  Dec 2011 and also may have other beneficial effects as per the latest review in [place] vol 193 p1345 Jun 20016.  This may not always be cause and effect, but given how universally unimpressive and expensive  all the other  Drugs developed to day  have been for pf,   rec continue   rx ppi / diet/ lifestyle modification and f/u with serial walking sats and lung volumes for now to put more points on the curve / establish firm baseline before considering additional measures.

## 2017-06-28 NOTE — Assessment & Plan Note (Signed)
Reduce norvasc to 5 mg daily as may be interfering with hypoxic pulmonary vasocontriction and may do better on med that addresses diastolic dysfunction like verapamil or bisoprolol but will wait until returns for med reconciliatoin    To keep things simple, I have asked the patient to first separate medicines that are perceived as maintenance, that is to be taken daily "no matter what", from those medicines that are taken on only on an as-needed basis and I have given the patient examples of both, and then return to see our NP to generate a  detailed  medication calendar which should be followed until the next physician sees the patient and updates it.

## 2017-06-30 ENCOUNTER — Telehealth: Payer: Self-pay | Admitting: Internal Medicine

## 2017-06-30 NOTE — Telephone Encounter (Signed)
Patient brought FMLA paperwork with her to her appointment with MW - Fwd to [employer] via interoffice mail - 06/30/17 -pr

## 2017-07-08 ENCOUNTER — Emergency Department (HOSPITAL_COMMUNITY): Payer: Medicare Other

## 2017-07-08 ENCOUNTER — Inpatient Hospital Stay (HOSPITAL_COMMUNITY)
Admission: EM | Admit: 2017-07-08 | Discharge: 2017-07-12 | DRG: 638 | Disposition: A | Discharge status: Skilled Nursing Facility | Payer: Medicare Other | Attending: Internal Medicine | Admitting: Internal Medicine

## 2017-07-08 ENCOUNTER — Encounter (HOSPITAL_COMMUNITY): Payer: Self-pay

## 2017-07-08 DIAGNOSIS — R7309 Other abnormal glucose: Secondary | ICD-10-CM | POA: Diagnosis not present

## 2017-07-08 DIAGNOSIS — E871 Hypo-osmolality and hyponatremia: Secondary | ICD-10-CM | POA: Diagnosis present

## 2017-07-08 DIAGNOSIS — E1149 Type 2 diabetes mellitus with other diabetic neurological complication: Secondary | ICD-10-CM

## 2017-07-08 DIAGNOSIS — Z86711 Personal history of pulmonary embolism: Secondary | ICD-10-CM | POA: Diagnosis present

## 2017-07-08 DIAGNOSIS — Z7901 Long term (current) use of anticoagulants: Secondary | ICD-10-CM | POA: Diagnosis not present

## 2017-07-08 DIAGNOSIS — Z90710 Acquired absence of both cervix and uterus: Secondary | ICD-10-CM

## 2017-07-08 DIAGNOSIS — B952 Enterococcus as the cause of diseases classified elsewhere: Secondary | ICD-10-CM | POA: Diagnosis present

## 2017-07-08 DIAGNOSIS — F329 Major depressive disorder, single episode, unspecified: Secondary | ICD-10-CM | POA: Diagnosis present

## 2017-07-08 DIAGNOSIS — F419 Anxiety disorder, unspecified: Secondary | ICD-10-CM | POA: Diagnosis present

## 2017-07-08 DIAGNOSIS — Z86718 Personal history of other venous thrombosis and embolism: Secondary | ICD-10-CM

## 2017-07-08 DIAGNOSIS — Y92009 Unspecified place in unspecified non-institutional (private) residence as the place of occurrence of the external cause: Secondary | ICD-10-CM

## 2017-07-08 DIAGNOSIS — Z955 Presence of coronary angioplasty implant and graft: Secondary | ICD-10-CM

## 2017-07-08 DIAGNOSIS — Z880 Allergy status to penicillin: Secondary | ICD-10-CM

## 2017-07-08 DIAGNOSIS — J90 Pleural effusion, not elsewhere classified: Secondary | ICD-10-CM | POA: Diagnosis present

## 2017-07-08 DIAGNOSIS — J9611 Chronic respiratory failure with hypoxia: Secondary | ICD-10-CM | POA: Diagnosis not present

## 2017-07-08 DIAGNOSIS — E1140 Type 2 diabetes mellitus with diabetic neuropathy, unspecified: Secondary | ICD-10-CM | POA: Diagnosis not present

## 2017-07-08 DIAGNOSIS — R739 Hyperglycemia, unspecified: Secondary | ICD-10-CM | POA: Diagnosis present

## 2017-07-08 DIAGNOSIS — K5900 Constipation, unspecified: Secondary | ICD-10-CM | POA: Diagnosis present

## 2017-07-08 DIAGNOSIS — E785 Hyperlipidemia, unspecified: Secondary | ICD-10-CM | POA: Diagnosis present

## 2017-07-08 DIAGNOSIS — E876 Hypokalemia: Secondary | ICD-10-CM | POA: Diagnosis not present

## 2017-07-08 DIAGNOSIS — I2510 Atherosclerotic heart disease of native coronary artery without angina pectoris: Secondary | ICD-10-CM | POA: Diagnosis present

## 2017-07-08 DIAGNOSIS — K219 Gastro-esophageal reflux disease without esophagitis: Secondary | ICD-10-CM | POA: Diagnosis present

## 2017-07-08 DIAGNOSIS — E1165 Type 2 diabetes mellitus with hyperglycemia: Secondary | ICD-10-CM | POA: Diagnosis not present

## 2017-07-08 DIAGNOSIS — N390 Urinary tract infection, site not specified: Secondary | ICD-10-CM

## 2017-07-08 DIAGNOSIS — E872 Acidosis: Secondary | ICD-10-CM | POA: Diagnosis present

## 2017-07-08 DIAGNOSIS — J849 Interstitial pulmonary disease, unspecified: Secondary | ICD-10-CM | POA: Diagnosis present

## 2017-07-08 DIAGNOSIS — T380X5A Adverse effect of glucocorticoids and synthetic analogues, initial encounter: Secondary | ICD-10-CM | POA: Diagnosis present

## 2017-07-08 DIAGNOSIS — E875 Hyperkalemia: Secondary | ICD-10-CM | POA: Diagnosis present

## 2017-07-08 DIAGNOSIS — Z794 Long term (current) use of insulin: Secondary | ICD-10-CM | POA: Diagnosis not present

## 2017-07-08 DIAGNOSIS — I509 Heart failure, unspecified: Secondary | ICD-10-CM | POA: Diagnosis present

## 2017-07-08 DIAGNOSIS — Z7952 Long term (current) use of systemic steroids: Secondary | ICD-10-CM

## 2017-07-08 DIAGNOSIS — Z66 Do not resuscitate: Secondary | ICD-10-CM | POA: Diagnosis present

## 2017-07-08 DIAGNOSIS — R0602 Shortness of breath: Secondary | ICD-10-CM

## 2017-07-08 DIAGNOSIS — J449 Chronic obstructive pulmonary disease, unspecified: Secondary | ICD-10-CM | POA: Diagnosis present

## 2017-07-08 DIAGNOSIS — Z9049 Acquired absence of other specified parts of digestive tract: Secondary | ICD-10-CM

## 2017-07-08 DIAGNOSIS — Z87891 Personal history of nicotine dependence: Secondary | ICD-10-CM

## 2017-07-08 DIAGNOSIS — I10 Essential (primary) hypertension: Secondary | ICD-10-CM | POA: Diagnosis present

## 2017-07-08 DIAGNOSIS — Z981 Arthrodesis status: Secondary | ICD-10-CM

## 2017-07-08 DIAGNOSIS — B9620 Unspecified Escherichia coli [E. coli] as the cause of diseases classified elsewhere: Secondary | ICD-10-CM

## 2017-07-08 DIAGNOSIS — I110 Hypertensive heart disease with heart failure: Secondary | ICD-10-CM | POA: Diagnosis present

## 2017-07-08 HISTORY — DX: Personal history of peptic ulcer disease: Z87.11

## 2017-07-08 HISTORY — DX: Type 2 diabetes mellitus with diabetic peripheral angiopathy without gangrene: E11.51

## 2017-07-08 HISTORY — DX: Personal history of other diseases of the digestive system: Z87.19

## 2017-07-08 HISTORY — DX: Other pulmonary embolism without acute cor pulmonale: I26.99

## 2017-07-08 HISTORY — DX: Pure hypercholesterolemia, unspecified: E78.00

## 2017-07-08 HISTORY — DX: Essential (primary) hypertension: I10

## 2017-07-08 HISTORY — DX: Type 2 diabetes mellitus without complications: E11.9

## 2017-07-08 HISTORY — DX: Major depressive disorder, single episode, unspecified: F32.9

## 2017-07-08 HISTORY — DX: Dependence on supplemental oxygen: Z99.81

## 2017-07-08 HISTORY — DX: Gastro-esophageal reflux disease without esophagitis: K21.9

## 2017-07-08 HISTORY — DX: Unspecified asthma, uncomplicated: J45.909

## 2017-07-08 HISTORY — DX: Depression, unspecified: F32.A

## 2017-07-08 HISTORY — DX: Acute embolism and thrombosis of unspecified femoral vein: I82.419

## 2017-07-08 HISTORY — DX: Pneumonia, unspecified organism: J18.9

## 2017-07-08 LAB — CBG MONITORING, ED
GLUCOSE-CAPILLARY: 571 mg/dL — AB (ref 65–99)
GLUCOSE-CAPILLARY: 420 mg/dL — AB (ref 65–99)

## 2017-07-08 LAB — CBC
HEMATOCRIT: 41.9 % (ref 36.0–46.0)
HEMOGLOBIN: 14 g/dL (ref 12.0–15.0)
MCH: 29.8 pg (ref 26.0–34.0)
MCHC: 33.4 g/dL (ref 30.0–36.0)
MCV: 89.1 fL (ref 78.0–100.0)
Platelets: 177 10*3/uL (ref 150–400)
RBC: 4.7 MIL/uL (ref 3.87–5.11)
RDW: 15 % (ref 11.5–15.5)
WBC: 9.2 10*3/uL (ref 4.0–10.5)

## 2017-07-08 LAB — I-STAT VENOUS BLOOD GAS, ED
Acid-Base Excess: 21 mmol/L — ABNORMAL HIGH (ref 0.0–2.0)
Bicarbonate: 48.2 mmol/L — ABNORMAL HIGH (ref 20.0–28.0)
O2 Saturation: 86 %
PCO2 VEN: 62 mmHg — AB (ref 44.0–60.0)
PO2 VEN: 49 mmHg — AB (ref 32.0–45.0)
TCO2: >50 mmol/L — ABNORMAL HIGH (ref 22–32)
pH, Ven: 7.499 — ABNORMAL HIGH (ref 7.250–7.430)

## 2017-07-08 LAB — I-STAT CG4 LACTIC ACID, ED: LACTIC ACID, VENOUS: 3.68 mmol/L — AB (ref 0.5–1.9)

## 2017-07-08 LAB — BASIC METABOLIC PANEL
ANION GAP: 16 — AB (ref 5–15)
BUN: 21 mg/dL — ABNORMAL HIGH (ref 6–20)
CO2: 38 mmol/L — ABNORMAL HIGH (ref 22–32)
Calcium: 9.3 mg/dL (ref 8.9–10.3)
Chloride: 73 mmol/L — ABNORMAL LOW (ref 101–111)
Creatinine, Ser: 1.4 mg/dL — ABNORMAL HIGH (ref 0.44–1.00)
GFR, EST AFRICAN AMERICAN: 44 mL/min — AB (ref >60)
GFR, EST NON AFRICAN AMERICAN: 38 mL/min — AB (ref >60)
Glucose, Bld: 533 mg/dL (ref 65–99)
POTASSIUM: 2.7 mmol/L — AB (ref 3.5–5.1)
SODIUM: 127 mmol/L — AB (ref 135–145)

## 2017-07-08 MED ORDER — SODIUM CHLORIDE 0.9 % IV BOLUS (SEPSIS)
500.0000 mL | Freq: Once | INTRAVENOUS | Status: AC
  Administered 2017-07-08: 500 mL via INTRAVENOUS

## 2017-07-08 MED ORDER — POTASSIUM CHLORIDE 10 MEQ/100ML IV SOLN
10.0000 meq | Freq: Once | INTRAVENOUS | Status: AC
  Administered 2017-07-08: 10 meq via INTRAVENOUS
  Filled 2017-07-08: qty 100

## 2017-07-08 NOTE — ED Triage Notes (Signed)
Pt with hx of DM via EMS from daughter's home with hyperglycemia, generalized weakness, and ALOC. Per EMS, pt's family reports pt has been experiencing weakness and periods of confusion x 2-3 days. Pt reports nausea, dry mouth and loss of appetite. Pt reports she did not take her prescribed 8 U of novolog today at lunch but did take her evening dose. Pt A&Ox4 at this time, on 5L [place] at baseline. Denies fever, vomiting, diarrhea. EMS VS: CBG > 600 110/78, 112 HR, RR 20, 94% on 5L [place].

## 2017-07-08 NOTE — H&P (Signed)
History and Physical    Peggy Todd OZD:[id number] DOB: 1951/05/22 DOA: 07/08/2017  PCP: System, Pcp Not In  Patient coming from: home  Chief Complaint:   Feeling weak and sugar high  HPI: Peggy Todd is a 66 y.o. female with medical history significant of IDDM, HTN comes in with over a day of not feeling right, feeling weak and did not take her insulin today.  Her glucose have been over 500.  Normally she runs between 200-300 range.  No cough.  No fevers.  No n/v/d.  No abdominal pain or chest pain.  She has interstitial lung disease for which she has been on prednisone and cellcept for the last year.  She is moving here from elsewhere and in the process of establishing care with our pulmonology team.  Pt found to have a k level of 2.7 and sugar over 500.  Not acidotic.  Referred for admission for treatment of both issues.  Review of Systems: As per HPI otherwise 10 point review of systems negative.   Past Medical History:  Diagnosis Date  . Asthma   . Depression   . Diabetes mellitus without complication (HCC)   . Hypertension     Past Surgical History:  Procedure Laterality Date  . ABDOMINAL HYSTERECTOMY    . APPENDECTOMY    . BACK SURGERY    . GALLBLADDER SURGERY       reports that she has quit smoking. She has never used smokeless tobacco. She reports that she does not drink alcohol or use drugs.  Allergies  Allergen Reactions  . Penicillins Swelling  . Codeine Other (See Comments)    INSOMNIA, m "makes me hyper"  . Atorvastatin Other (See Comments)    Generalized myalgia    History reviewed. No pertinent family history.  No premature CAD  Prior to Admission medications   Medication Sig Start Date End Date Taking? Authorizing Provider  albuterol (PROAIR HFA) 108 (90 Base) MCG/ACT inhaler Inhale 2 puffs into the lungs 4 (four) times daily as needed. 10/30/13  Yes [provider]  amLODipine (NORVASC) 10 MG tablet One half daily Patient taking differently:  Take 10 mg by mouth daily. One half daily 06/27/17  Yes Nyoka Cowden, MD  clopidogrel (PLAVIX) 75 MG tablet Take 75 mg by mouth daily.   Yes [provider]  Cyanocobalamin (B-12 PO) Take 1 tablet by mouth daily.   Yes [provider]  furosemide (LASIX) 40 MG tablet Take 40 mg by mouth daily.   Yes [provider]  insulin aspart (NOVOLOG) 100 UNIT/ML injection Inject 100 Units into the skin 3 (three) times daily before meals.   Yes [provider]  magnesium citrate SOLN Take 10 mLs by mouth daily as needed for severe constipation.   Yes [provider]  mycophenolate (CELLCEPT) 500 MG tablet Take 1,000 mg by mouth 2 (two) times daily.    Yes [provider]  nitroGLYCERIN (NITROSTAT) 0.4 MG SL tablet Place 0.4 mg under the tongue every 5 (five) minutes as needed for chest pain.   Yes [provider]  OXYGEN 8-10 lpm   Yes [provider]  pioglitazone (ACTOS) 15 MG tablet Take 15 mg by mouth every other day.   Yes [provider]  polyethylene glycol (MIRALAX / GLYCOLAX) packet Take 17 g by mouth daily.   Yes [provider]  pregabalin (LYRICA) 100 MG capsule Take 100 mg by mouth 2 (two) times daily.    Yes [provider]  rosuvastatin (CRESTOR) 20 MG tablet Take 20 mg by mouth daily.   Yes [provider]  sertraline (ZOLOFT) 25 MG tablet Take 25 mg by mouth daily.   Yes [provider]  traMADol (ULTRAM) 50 MG tablet 1-2 every 4 hours as needed for cough or pain Patient taking differently: Take 50-100 mg by mouth daily.  06/27/17  Yes Nyoka Cowden, MD  pantoprazole (PROTONIX) 40 MG tablet Take 30- 60 min before your first and last meals of the day Patient not taking: Reported on 07/08/2017 06/27/17   Nyoka Cowden, MD    Physical Exam: Vitals:   07/08/17 2100 07/08/17 2200 07/08/17 2230 07/08/17 2300  BP: 124/76 119/79 120/80 127/83  Pulse: (!) 108     Resp: (!) 34  (!) 27 19 (!) 22  Temp:      TempSrc:      SpO2: 90%   94%  Weight:      Height:        Constitutional: NAD, calm, comfortable Vitals:   07/08/17 2100 07/08/17 2200 07/08/17 2230 07/08/17 2300  BP: 124/76 119/79 120/80 127/83  Pulse: (!) 108     Resp: (!) 34 (!) 27 19 (!) 22  Temp:      TempSrc:      SpO2: 90%   94%  Weight:      Height:       Eyes: PERRL, lids and conjunctivae normal ENMT: Mucous membranes are moist. Posterior pharynx clear of any exudate or lesions.Normal dentition.  Neck: normal, supple, no masses, no thyromegaly Respiratory: clear to auscultation bilaterally, no wheezing, no crackles. Normal respiratory effort. No accessory muscle use.  Cardiovascular: Regular rate and rhythm, no murmurs / rubs / gallops. No extremity edema. 2+ pedal pulses. No carotid bruits.  Abdomen: no tenderness, no masses palpated. No hepatosplenomegaly. Bowel sounds positive.  Musculoskeletal: no clubbing / cyanosis. No joint deformity upper and lower extremities. Good ROM, no contractures. Normal muscle tone.  Skin: no rashes, lesions, ulcers. No induration Neurologic: CN 2-12 grossly intact. Sensation intact, DTR normal. Strength 5/5 in all 4.  Psychiatric: Normal judgment and insight. Alert and oriented x 3. Normal mood.    Labs on Admission: I have personally reviewed following labs and imaging studies  CBC:  Recent Labs Lab 07/08/17 2028  WBC 9.2  HGB 14.0  HCT 41.9  MCV 89.1  PLT 177   Basic Metabolic Panel:  Recent Labs Lab 07/08/17 2028  NA 127*  K 2.7*  CL 73*  CO2 38*  GLUCOSE 533*  BUN 21*  CREATININE 1.40*  CALCIUM 9.3   GFR: Estimated Creatinine Clearance: 34.9 mL/min (A) (by C-G formula based on SCr of 1.4 mg/dL (H)).  CBG:  Recent Labs Lab 07/08/17 2029 07/08/17 2305  GLUCAP 571* 420*    Radiological Exams on Admission: Dg Chest Portable 1 View  Result Date: 07/08/2017 CLINICAL DATA:  Acute onset of mid chest tightness, dyspnea and  dry cough. Initial encounter. EXAM: PORTABLE CHEST 1 VIEW COMPARISON:  Chest radiograph performed 11/18/2016 FINDINGS: The lungs are hypoexpanded. Vascular congestion is noted. Bilateral central airspace opacities may reflect atypical pneumonia or pulmonary edema. Would correlate with the chronicity of the patient's symptoms, as this is similar in appearance to the prior study from February. No definite pleural effusion or pneumothorax is seen. The cardiomediastinal silhouette is borderline normal in size. No acute osseous abnormalities are identified. Cervical spinal fusion hardware is partially imaged. Clips are noted within the  right upper quadrant, reflecting prior cholecystectomy. IMPRESSION: Lungs hypoexpanded. Vascular congestion. Bilateral central airspace opacities are similar in appearance to the prior study from February, and may reflect atypical pneumonia or pulmonary edema. Would correlate with the chronicity of the patient's symptoms, to help exclude underlying chronic infectious or inflammatory process. Electronically Signed   By: Roanna Raider M.D.   On: 07/08/2017 21:28    Old chart reviewed Case discussed with edp   Assessment/Plan 67 yo female with uncontrolled dm and hypokalemia  Principal Problem:   Hypokalemia- replete with iv kcl and thru ivf overnight.  Ck mag level.  Ck 12 lead ekg.  Tele monitor in ED shows nothing significant.  Repeat level in the am  Active Problems:   Anxiety and depression- stable   History of pulmonary embolism- noted, not on anything chronically per med list   Essential hypertension- stalbe   Idiopathic interstitial disease (HCC)- stable cont cellcept and prednisone per her report   Type 2 diabetes mellitus with neurologic complication, with long-term current use of insulin (HCC)- place on ssi, only takes 8 units with meals and no long acting   Chronic respiratory failure with hypoxia (HCC)- stable, on 2 liters at home   Hyperglycemia- as above,  replete k first before giving insulin       DVT prophylaxis:  scds Code Status:  Full code Family Communication:  none Disposition Plan:  Per day team Consults called:  none Admission status:  observation   Sequan Auxier A MD [facility]  If 7PM-7AM, please contact night-coverage [email or web address] Password [email or web address]  07/08/2017, 11:38 PM

## 2017-07-08 NOTE — ED Provider Notes (Signed)
[facility]EMER[facility]ENCY DEPT Provider Note   CSN: [id number] Arrival date & time: 07/08/17  2022     History   Chief Complaint Chief Complaint  Patient presents with  . Hyperglycemia  . Altered Mental Status    HPI Peggy Todd is a 66 y.o. female.  HPI Patient presents with hyperglycemia and generalized weakness. Has a history of diabetes. Also has some interstitial fibrosis. Is on CellCept and some prednisone. Has been on less prednisone recently however. Has had occasional cough. Breathing is not really different than normal. Has not had increased urination. States she felt bad and did not have her insulin at lunch today. Sugar 500 upon arrival. No fevers. Past Medical History:  Diagnosis Date  . Asthma   . Depression   . Diabetes mellitus without complication (HCC)   . Hypertension     Patient Active Problem List   Diagnosis Date Noted  . Upper airway cough syndrome 06/28/2017  . DOE (dyspnea on exertion) 06/27/2017  . Chronic respiratory failure with hypoxia (HCC) 06/27/2017  . ILD (interstitial lung disease) (HCC) 11/06/2016  . Hypoxemia 11/06/2016  . 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 Acute respiratory failure with hypoxia (HCC) 12/26/2015  . Opioid overdose (HCC) 12/26/2015  . Acute hypoxemic respiratory failure (HCC) 10/20/2015  . Anxiety and depression 10/20/2015  . Gastroesophageal reflux disease without esophagitis 10/20/2015  . Iron deficiency anemia 10/20/2015  . Pulmonary edema 10/20/2015  . Herniated nucleus pulposus, L4-5 left 08/08/2015  . Chronic anticoagulation 07/26/2015  . Dyslipidemia 07/26/2015  . Leg DVT (deep venous thromboembolism), chronic, left (HCC) 03/03/2013  . History of pulmonary embolism 02/28/2013  . Spinal stenosis of lumbar region without neurogenic claudication 08/25/2012  . Essential hypertension 06/06/2011  . Hypothyroidism 06/06/2011  . Type 2 diabetes mellitus with neurologic complication, with long-term current  use of insulin (HCC) 06/06/2011    Past Surgical History:  Procedure Laterality Date  . ABDOMINAL HYSTERECTOMY    . APPENDECTOMY    . BACK SURGERY    . GALLBLADDER SURGERY      OB History    No data available       Home Medications    Prior to Admission medications   Medication Sig Start Date End Date Taking? Authorizing Provider  albuterol (PROAIR HFA) 108 (90 Base) MCG/ACT inhaler Inhale 2 puffs into the lungs 4 (four) times daily as needed. 10/30/13   [provider]  amLODipine (NORVASC) 10 MG tablet One half daily 06/27/17   Nyoka Cowden, MD  azelastine (ASTELIN) 0.1 % nasal spray Place 1 spray into both nostrils 2 (two) times daily. Use in each nostril as directed    [provider]  clopidogrel (PLAVIX) 75 MG tablet Take 75 mg by mouth daily.    [provider]  Cyanocobalamin (B-12 PO) Take 1 tablet by mouth daily.    [provider]  dapagliflozin propanediol (FARXIGA) 5 MG TABS tablet Take 5 mg by mouth daily.    [provider]  furosemide (LASIX) 40 MG tablet Take 40 mg by mouth daily.    [provider]  insulin detemir (LEVEMIR) 100 UNIT/ML injection Inject into the skin at bedtime.    [provider]  magnesium citrate SOLN Take 10 mLs by mouth daily as needed for severe constipation.    [provider]  mycophenolate (CELLCEPT) 500 MG tablet Take 500 mg by mouth 2 (two) times daily.    [provider]  nitroGLYCERIN (NITROSTAT) 0.4 MG SL tablet Place  0.4 mg under the tongue every 5 (five) minutes as needed for chest pain.    [provider]  OXYGEN 8-10 lpm    [provider]  pantoprazole (PROTONIX) 40 MG tablet Take 30- 60 min before your first and last meals of the day 06/27/17   Nyoka Cowden, MD  pioglitazone (ACTOS) 15 MG tablet Take 15 mg by mouth every other day.    [provider]  polyethylene glycol (MIRALAX / GLYCOLAX) packet Take 17 g by mouth  daily.    [provider]  predniSONE (DELTASONE) 10 MG tablet Take 10 mg by mouth daily with breakfast.    [provider]  pregabalin (LYRICA) 100 MG capsule Take 100 mg by mouth daily.    [provider]  rosuvastatin (CRESTOR) 20 MG tablet Take 20 mg by mouth daily.    [provider]  sertraline (ZOLOFT) 25 MG tablet Take 25 mg by mouth daily.    [provider]  traMADol [name]) 50 MG tablet 1-2 every 4 hours as needed for cough or pain 06/27/17   Nyoka Cowden, MD    Family History History reviewed. No pertinent family history.  Social History Social History  Substance Use Topics  . Smoking status: Former Games developer  . Smokeless tobacco: Never Used  . Alcohol use No     Allergies   Penicillins; Codeine; and Atorvastatin   Review of Systems Review of Systems  Constitutional: Positive for appetite change and fatigue.  HENT: Negative for congestion.   Respiratory: Positive for cough and shortness of breath.   Cardiovascular: Negative for chest pain.  Gastrointestinal: Negative for abdominal pain.  Genitourinary: Negative for dysuria and flank pain.  Musculoskeletal: Negative for back pain.  Skin: Negative for rash.  Neurological: Negative for tremors and numbness.  Psychiatric/Behavioral: Positive for confusion.     Physical Exam Updated Vital Signs BP 135/79 (BP Location: Left Arm)   Pulse (!) 106   Temp 97.6 F (36.4 C) (Oral)   Resp (!) 32   Ht 5' (1.524 m)   Wt 71.7 kg (158 lb)   SpO2 95%   BMI 30.86 kg/m   Physical Exam  Constitutional: She appears well-developed.  HENT:  Head: Normocephalic.  Mucous membranes are dry  Eyes: Pupils are equal, round, and reactive to light.  Neck: Neck supple.  Cardiovascular: Normal rate.   Pulmonary/Chest:  Mildly harsh breath sounds throughout patient is on nasal cannula oxygen.  Abdominal: Soft.  Musculoskeletal: She exhibits no edema.  Neurological: She is alert.    Skin: Skin is warm. Capillary refill takes less than 2 seconds.  Psychiatric: She has a normal mood and affect.     ED Treatments / Results  Labs (all labs ordered are listed, but only abnormal results are displayed) Labs Reviewed  BASIC METABOLIC PANEL - Abnormal; Notable for the following:       Result Value   Sodium 127 (*)    Potassium 2.7 (*)    Chloride 73 (*)    CO2 38 (*)    Glucose, Bld 533 (*)    BUN 21 (*)    Creatinine, Ser 1.40 (*)    GFR calc non Af Amer 38 (*)    GFR calc Af Amer 44 (*)    Anion gap 16 (*)    All other components within normal limits  CBG MONITORING, ED - Abnormal; Notable for the following:    Glucose-Capillary 571 (*)    All other  components within normal limits  I-STAT CG4 LACTIC ACID, ED - Abnormal; Notable for the following:    Lactic Acid, Venous 3.68 (*)    All other components within normal limits  I-STAT VENOUS BLOOD GAS, ED - Abnormal; Notable for the following:    pH, Ven 7.499 (*)    pCO2, Ven 62.0 (*)    pO2, Ven 49.0 (*)    Bicarbonate 48.2 (*)    TCO2 >50 (*)    Acid-Base Excess 21.0 (*)    All other components within normal limits  CBC  URINALYSIS, ROUTINE W REFLEX MICROSCOPIC    EKG  EKG Interpretation None       Radiology Dg Chest Portable 1 View  Result Date: 07/08/2017 CLINICAL DATA:  Acute onset of mid chest tightness, dyspnea and dry cough. Initial encounter. EXAM: PORTABLE CHEST 1 VIEW COMPARISON:  Chest radiograph performed 11/18/2016 FINDINGS: The lungs are hypoexpanded. Vascular congestion is noted. Bilateral central airspace opacities may reflect atypical pneumonia or pulmonary edema. Would correlate with the chronicity of the patient's symptoms, as this is similar in appearance to the prior study from February. No definite pleural effusion or pneumothorax is seen. The cardiomediastinal silhouette is borderline normal in size. No acute osseous abnormalities are identified. Cervical spinal fusion hardware is  partially imaged. Clips are noted within the right upper quadrant, reflecting prior cholecystectomy. IMPRESSION: Lungs hypoexpanded. Vascular congestion. Bilateral central airspace opacities are similar in appearance to the prior study from February, and may reflect atypical pneumonia or pulmonary edema. Would correlate with the chronicity of the patient's symptoms, to help exclude underlying chronic infectious or inflammatory process. Electronically Signed   By: Roanna Raider M.D.   On: 07/08/2017 21:28    Procedures Procedures (including critical care time)  Medications Ordered in ED Medications  sodium chloride 0.9 % bolus 500 mL (500 mLs Intravenous New Bag/Given 07/08/17 2104)  potassium chloride 10 mEq in 100 mL IVPB (not administered)     Initial Impression / Assessment and Plan / ED Course  I have reviewed the triage vital signs and the nursing notes.  Pertinent labs & imaging results that were available during my care of the patient were reviewed by me and considered in my medical decision making (see chart for details).     Patient has chronic lung disease. Chronic oxygen. Found to have hyperglycemia. Does have an anion gap but is also alkalotic. Likely is chronically alkalotic due to her respiratory status. Also hypokalemic. X-ray shows some chronic changes. Has had no real change in her breathing and normal white count without fever. I think with her hypokalemia and tenuous respiratory status patient benefit from admission for management of hyperglycemia and hypokalemia.  Final Clinical Impressions(s) / ED Diagnoses   Final diagnoses:  Hyperglycemia  Hypokalemia    New Prescriptions New Prescriptions   No medications on file     Benjiman Core, MD 07/08/17 2149

## 2017-07-09 ENCOUNTER — Ambulatory Visit: Payer: Medicare Other | Admitting: Family Medicine

## 2017-07-09 ENCOUNTER — Encounter (HOSPITAL_COMMUNITY): Payer: Self-pay | Admitting: General Practice

## 2017-07-09 ENCOUNTER — Telehealth: Payer: Self-pay | Admitting: Internal Medicine

## 2017-07-09 DIAGNOSIS — Z66 Do not resuscitate: Secondary | ICD-10-CM | POA: Diagnosis present

## 2017-07-09 DIAGNOSIS — I10 Essential (primary) hypertension: Secondary | ICD-10-CM

## 2017-07-09 DIAGNOSIS — F419 Anxiety disorder, unspecified: Secondary | ICD-10-CM

## 2017-07-09 DIAGNOSIS — E785 Hyperlipidemia, unspecified: Secondary | ICD-10-CM | POA: Diagnosis present

## 2017-07-09 DIAGNOSIS — E872 Acidosis: Secondary | ICD-10-CM | POA: Diagnosis present

## 2017-07-09 DIAGNOSIS — Z794 Long term (current) use of insulin: Secondary | ICD-10-CM | POA: Diagnosis not present

## 2017-07-09 DIAGNOSIS — J9611 Chronic respiratory failure with hypoxia: Secondary | ICD-10-CM | POA: Diagnosis present

## 2017-07-09 DIAGNOSIS — J449 Chronic obstructive pulmonary disease, unspecified: Secondary | ICD-10-CM | POA: Diagnosis present

## 2017-07-09 DIAGNOSIS — E1140 Type 2 diabetes mellitus with diabetic neuropathy, unspecified: Secondary | ICD-10-CM | POA: Diagnosis not present

## 2017-07-09 DIAGNOSIS — Y92009 Unspecified place in unspecified non-institutional (private) residence as the place of occurrence of the external cause: Secondary | ICD-10-CM | POA: Diagnosis not present

## 2017-07-09 DIAGNOSIS — J849 Interstitial pulmonary disease, unspecified: Secondary | ICD-10-CM | POA: Diagnosis present

## 2017-07-09 DIAGNOSIS — Z86718 Personal history of other venous thrombosis and embolism: Secondary | ICD-10-CM | POA: Diagnosis not present

## 2017-07-09 DIAGNOSIS — Z7952 Long term (current) use of systemic steroids: Secondary | ICD-10-CM | POA: Diagnosis not present

## 2017-07-09 DIAGNOSIS — Z7901 Long term (current) use of anticoagulants: Secondary | ICD-10-CM | POA: Diagnosis not present

## 2017-07-09 DIAGNOSIS — E1165 Type 2 diabetes mellitus with hyperglycemia: Secondary | ICD-10-CM | POA: Diagnosis present

## 2017-07-09 DIAGNOSIS — Z86711 Personal history of pulmonary embolism: Secondary | ICD-10-CM | POA: Diagnosis not present

## 2017-07-09 DIAGNOSIS — F329 Major depressive disorder, single episode, unspecified: Secondary | ICD-10-CM

## 2017-07-09 DIAGNOSIS — K219 Gastro-esophageal reflux disease without esophagitis: Secondary | ICD-10-CM

## 2017-07-09 DIAGNOSIS — N390 Urinary tract infection, site not specified: Secondary | ICD-10-CM | POA: Diagnosis present

## 2017-07-09 DIAGNOSIS — B9620 Unspecified Escherichia coli [E. coli] as the cause of diseases classified elsewhere: Secondary | ICD-10-CM | POA: Diagnosis present

## 2017-07-09 DIAGNOSIS — T380X5A Adverse effect of glucocorticoids and synthetic analogues, initial encounter: Secondary | ICD-10-CM | POA: Diagnosis present

## 2017-07-09 DIAGNOSIS — Z955 Presence of coronary angioplasty implant and graft: Secondary | ICD-10-CM | POA: Diagnosis not present

## 2017-07-09 DIAGNOSIS — R739 Hyperglycemia, unspecified: Secondary | ICD-10-CM | POA: Diagnosis not present

## 2017-07-09 DIAGNOSIS — R0602 Shortness of breath: Secondary | ICD-10-CM | POA: Diagnosis not present

## 2017-07-09 DIAGNOSIS — I2510 Atherosclerotic heart disease of native coronary artery without angina pectoris: Secondary | ICD-10-CM | POA: Diagnosis present

## 2017-07-09 DIAGNOSIS — E875 Hyperkalemia: Secondary | ICD-10-CM | POA: Diagnosis present

## 2017-07-09 DIAGNOSIS — E871 Hypo-osmolality and hyponatremia: Secondary | ICD-10-CM | POA: Diagnosis present

## 2017-07-09 DIAGNOSIS — E876 Hypokalemia: Secondary | ICD-10-CM | POA: Diagnosis present

## 2017-07-09 DIAGNOSIS — J90 Pleural effusion, not elsewhere classified: Secondary | ICD-10-CM | POA: Diagnosis present

## 2017-07-09 LAB — BASIC METABOLIC PANEL
ANION GAP: 14 (ref 5–15)
Anion gap: 14 (ref 5–15)
BUN: 17 mg/dL (ref 6–20)
BUN: 19 mg/dL (ref 6–20)
CALCIUM: 8.8 mg/dL — AB (ref 8.9–10.3)
CALCIUM: 8.9 mg/dL (ref 8.9–10.3)
CO2: 39 mmol/L — ABNORMAL HIGH (ref 22–32)
CO2: 39 mmol/L — ABNORMAL HIGH (ref 22–32)
CREATININE: 1.22 mg/dL — AB (ref 0.44–1.00)
Chloride: 79 mmol/L — ABNORMAL LOW (ref 101–111)
Chloride: 80 mmol/L — ABNORMAL LOW (ref 101–111)
Creatinine, Ser: 1.22 mg/dL — ABNORMAL HIGH (ref 0.44–1.00)
GFR calc non Af Amer: 45 mL/min — ABNORMAL LOW (ref >60)
GFR, EST AFRICAN AMERICAN: 52 mL/min — AB (ref >60)
GFR, EST AFRICAN AMERICAN: 52 mL/min — AB (ref >60)
GFR, EST NON AFRICAN AMERICAN: 45 mL/min — AB (ref >60)
Glucose, Bld: 398 mg/dL — ABNORMAL HIGH (ref 65–99)
Glucose, Bld: 359 mg/dL — ABNORMAL HIGH (ref 65–99)
POTASSIUM: 2.5 mmol/L — AB (ref 3.5–5.1)
Potassium: 2.4 mmol/L — CL (ref 3.5–5.1)
SODIUM: 132 mmol/L — AB (ref 135–145)
Sodium: 133 mmol/L — ABNORMAL LOW (ref 135–145)

## 2017-07-09 LAB — CBC
HCT: 37.2 % (ref 36.0–46.0)
HEMOGLOBIN: 12.3 g/dL (ref 12.0–15.0)
MCH: 29.6 pg (ref 26.0–34.0)
MCHC: 33.1 g/dL (ref 30.0–36.0)
MCV: 89.4 fL (ref 78.0–100.0)
Platelets: 176 10*3/uL (ref 150–400)
RBC: 4.16 MIL/uL (ref 3.87–5.11)
RDW: 14.9 % (ref 11.5–15.5)
WBC: 7.9 10*3/uL (ref 4.0–10.5)

## 2017-07-09 LAB — URINALYSIS, ROUTINE W REFLEX MICROSCOPIC
BILIRUBIN URINE: NEGATIVE
Glucose, UA: >=500 mg/dL — AB
Hgb urine dipstick: NEGATIVE
KETONES UR: 20 mg/dL — AB
Leukocytes, UA: NEGATIVE
Nitrite: NEGATIVE
PROTEIN: 30 mg/dL — AB
Specific Gravity, Urine: 1.013 (ref 1.005–1.030)
pH: 6 (ref 5.0–8.0)

## 2017-07-09 LAB — GLUCOSE, CAPILLARY
GLUCOSE-CAPILLARY: 347 mg/dL — AB (ref 65–99)
GLUCOSE-CAPILLARY: 327 mg/dL — AB (ref 65–99)
GLUCOSE-CAPILLARY: 211 mg/dL — AB (ref 65–99)
GLUCOSE-CAPILLARY: 235 mg/dL — AB (ref 65–99)

## 2017-07-09 LAB — HEMOGLOBIN A1C
HEMOGLOBIN A1C: 9.3 % — AB (ref 4.8–5.6)
Mean Plasma Glucose: 220.21 mg/dL

## 2017-07-09 LAB — MAGNESIUM: Magnesium: 2 mg/dL (ref 1.7–2.4)

## 2017-07-09 LAB — LACTIC ACID, PLASMA
LACTIC ACID, VENOUS: 1.8 mmol/L (ref 0.5–1.9)
LACTIC ACID, VENOUS: 1.1 mmol/L (ref 0.5–1.9)

## 2017-07-09 MED ORDER — POTASSIUM CHLORIDE 20 MEQ/15ML (10%) PO SOLN
40.0000 meq | Freq: Once | ORAL | Status: AC
  Administered 2017-07-09: 40 meq via ORAL
  Filled 2017-07-09: qty 30

## 2017-07-09 MED ORDER — AMLODIPINE BESYLATE 10 MG PO TABS
10.0000 mg | ORAL_TABLET | Freq: Every day | ORAL | Status: DC
  Administered 2017-07-09 – 2017-07-12 (×4): 10 mg via ORAL
  Filled 2017-07-09 (×4): qty 1

## 2017-07-09 MED ORDER — BENZOCAINE 10 % MT GEL
Freq: Four times a day (QID) | OROMUCOSAL | Status: DC | PRN
  Filled 2017-07-09: qty 9

## 2017-07-09 MED ORDER — ROSUVASTATIN CALCIUM 20 MG PO TABS
20.0000 mg | ORAL_TABLET | Freq: Every day | ORAL | Status: DC
  Administered 2017-07-09 – 2017-07-12 (×4): 20 mg via ORAL
  Filled 2017-07-09 (×4): qty 1

## 2017-07-09 MED ORDER — ALBUTEROL SULFATE (2.5 MG/3ML) 0.083% IN NEBU: 2.5000 mg | INHALATION_SOLUTION | Freq: Four times a day (QID) | RESPIRATORY_TRACT | Status: DC | PRN

## 2017-07-09 MED ORDER — PREGABALIN 100 MG PO CAPS
100.0000 mg | ORAL_CAPSULE | Freq: Two times a day (BID) | ORAL | Status: DC
  Administered 2017-07-09 – 2017-07-12 (×8): 100 mg via ORAL
  Filled 2017-07-09 (×8): qty 1

## 2017-07-09 MED ORDER — MYCOPHENOLATE MOFETIL 250 MG PO CAPS
1000.0000 mg | ORAL_CAPSULE | Freq: Two times a day (BID) | ORAL | Status: DC
  Administered 2017-07-09 (×2): 1000 mg via ORAL
  Filled 2017-07-09 (×2): qty 4

## 2017-07-09 MED ORDER — INSULIN ASPART 100 UNIT/ML ~~LOC~~ SOLN
0.0000 [IU] | Freq: Three times a day (TID) | SUBCUTANEOUS | Status: DC
  Administered 2017-07-09 (×2): 7 [IU] via SUBCUTANEOUS

## 2017-07-09 MED ORDER — NITROGLYCERIN 0.4 MG SL SUBL: 0.4000 mg | SUBLINGUAL_TABLET | SUBLINGUAL | Status: DC | PRN

## 2017-07-09 MED ORDER — FAMOTIDINE 20 MG PO TABS
20.0000 mg | ORAL_TABLET | Freq: Every day | ORAL | Status: DC
  Administered 2017-07-09 – 2017-07-11 (×3): 20 mg via ORAL
  Filled 2017-07-09 (×3): qty 1

## 2017-07-09 MED ORDER — SERTRALINE HCL 50 MG PO TABS
25.0000 mg | ORAL_TABLET | Freq: Every day | ORAL | Status: DC
  Administered 2017-07-09 – 2017-07-12 (×4): 25 mg via ORAL
  Filled 2017-07-09 (×4): qty 1

## 2017-07-09 MED ORDER — IPRATROPIUM-ALBUTEROL 0.5-2.5 (3) MG/3ML IN SOLN
3.0000 mL | Freq: Four times a day (QID) | RESPIRATORY_TRACT | Status: DC
  Administered 2017-07-09: 3 mL via RESPIRATORY_TRACT
  Filled 2017-07-09 (×2): qty 3

## 2017-07-09 MED ORDER — CLOPIDOGREL BISULFATE 75 MG PO TABS
75.0000 mg | ORAL_TABLET | Freq: Every day | ORAL | Status: DC
  Administered 2017-07-09 – 2017-07-12 (×4): 75 mg via ORAL
  Filled 2017-07-09 (×4): qty 1

## 2017-07-09 MED ORDER — INSULIN ASPART 100 UNIT/ML ~~LOC~~ SOLN
0.0000 [IU] | Freq: Three times a day (TID) | SUBCUTANEOUS | Status: DC
  Administered 2017-07-09 – 2017-07-10 (×2): 3 [IU] via SUBCUTANEOUS
  Administered 2017-07-10: 7 [IU] via SUBCUTANEOUS
  Administered 2017-07-10: 9 [IU] via SUBCUTANEOUS
  Administered 2017-07-11: 1 [IU] via SUBCUTANEOUS

## 2017-07-09 MED ORDER — MYCOPHENOLATE MOFETIL 250 MG PO CAPS
500.0000 mg | ORAL_CAPSULE | Freq: Two times a day (BID) | ORAL | Status: DC
  Administered 2017-07-09 – 2017-07-12 (×6): 500 mg via ORAL
  Filled 2017-07-09 (×6): qty 2

## 2017-07-09 MED ORDER — SODIUM CHLORIDE 0.9 % IV SOLN: INTRAVENOUS | Status: DC

## 2017-07-09 MED ORDER — INSULIN ASPART 100 UNIT/ML ~~LOC~~ SOLN: 4.0000 [IU] | Freq: Three times a day (TID) | SUBCUTANEOUS | Status: DC

## 2017-07-09 MED ORDER — POTASSIUM CHLORIDE IN NACL 40-0.9 MEQ/L-% IV SOLN
INTRAVENOUS | Status: AC
  Administered 2017-07-09 (×2): 125 mL/h via INTRAVENOUS
  Filled 2017-07-09 (×2): qty 1000

## 2017-07-09 MED ORDER — POTASSIUM CHLORIDE 10 MEQ/100ML IV SOLN
10.0000 meq | INTRAVENOUS | Status: AC
  Administered 2017-07-09 (×4): 10 meq via INTRAVENOUS
  Filled 2017-07-09 (×4): qty 100

## 2017-07-09 MED ORDER — INSULIN ASPART 100 UNIT/ML ~~LOC~~ SOLN
4.0000 [IU] | Freq: Three times a day (TID) | SUBCUTANEOUS | Status: DC
  Administered 2017-07-09 – 2017-07-12 (×9): 4 [IU] via SUBCUTANEOUS

## 2017-07-09 MED ORDER — PREDNISONE 10 MG PO TABS
10.0000 mg | ORAL_TABLET | Freq: Every day | ORAL | Status: DC
  Administered 2017-07-09 – 2017-07-12 (×4): 10 mg via ORAL
  Filled 2017-07-09 (×4): qty 1

## 2017-07-09 MED ORDER — INSULIN ASPART 100 UNIT/ML ~~LOC~~ SOLN
0.0000 [IU] | Freq: Every day | SUBCUTANEOUS | Status: DC
  Administered 2017-07-09: 2 [IU] via SUBCUTANEOUS
  Administered 2017-07-10: 5 [IU] via SUBCUTANEOUS

## 2017-07-09 MED ORDER — PANTOPRAZOLE SODIUM 40 MG PO TBEC: 40.0000 mg | DELAYED_RELEASE_TABLET | Freq: Every day | ORAL | Status: DC

## 2017-07-09 MED ORDER — INSULIN DETEMIR 100 UNIT/ML ~~LOC~~ SOLN
18.0000 [IU] | Freq: Every day | SUBCUTANEOUS | Status: DC
  Administered 2017-07-09 – 2017-07-10 (×2): 18 [IU] via SUBCUTANEOUS
  Filled 2017-07-09 (×2): qty 0.18

## 2017-07-09 NOTE — Telephone Encounter (Signed)
Rec'd FMLA paperwork via interoffice mail from [employer] - fwd to MW's nurse for MW to complete -pr

## 2017-07-09 NOTE — Telephone Encounter (Signed)
MW as last MD that seen patient, Lanora Manis at [facility] In[facility] pharmacy needs to know what dosing of Cellcept is correct for patient. She stated patient told them she is taking  BID Our records show 1000 mg BID  And Lanora Manis noted in last OV note here it mentioned  BID.   Please advise STAT as they need a response within 30-45 minutes. Thanks.

## 2017-07-09 NOTE — Telephone Encounter (Signed)
Given to Dr. Sherene Sires to fill out and sign. Will return to [place] once it is completed.

## 2017-07-09 NOTE — Progress Notes (Signed)
Arrival Method: Patient arrived in stretcher from ED. Mental Orientation: alert and oriented Telemetry:22 Assessment: See Doc Flow sheets. Skin: Warm, dry and intact. IV: Peripheral right hand Pain:.denies Fall Prevention Safety Plan: Patient educated about fall prevention safety plan, understood and acknowledged. Admission Screening:6700 Orientation: Patient has been oriented to the unit, staff and to the room.

## 2017-07-09 NOTE — Progress Notes (Addendum)
Inpatient Diabetes Program Recommendations  AACE/ADA: New Consensus Statement on Inpatient Glycemic Control (2015)  Target Ranges:  Prepandial:   less than 140 mg/dL      Peak postprandial:   less than 180 mg/dL (1-2 hours)      Critically ill patients:  140 - 180 mg/dL   Results for Peggy Todd, Peggy Todd (MRN [id number]) as of 07/09/2017 09:55  Ref. Range 07/08/2017 20:29 07/08/2017 23:05 07/09/2017 07:48  Glucose-Capillary Latest Ref Range: 65 - 99 mg/dL 401 (HH) 027 (H) 253 (H)   Results for Peggy Todd, Peggy Todd (MRN [id number]) as of 07/09/2017 09:55  Ref. Range 07/09/2017 09:00  Hemoglobin A1C Latest Ref Range: 4.8 - 5.6 % 9.3 (H)   Review of Glycemic Control  Diabetes history: DM2 Outpatient Diabetes medications: Novolog 8 units TID with meals Current orders for Inpatient glycemic control: Novolog 0-9 units TID with meals  Inpatient Diabetes Program Recommendations: Insulin - Basal: Please consider ordering Levemir 18 units Q24H starting now (based on 71 kg x 0.25 units). Correction (SSI): Please consider ordering Novolog 0-5 units QHS for bedtime correction. Insulin - Meal Coverage: If post prandial glucose is consistently elevated, may need to order Novolog meal coverage as well. HgbA1C: A1C 9.3% on 07/09/17 indicates an average glucose 220 mg/dl.  NOTE: Spoke with patient about diabetes and home regimen for diabetes control. Patient reports that she is now living in [place] with her daughter as she recently moved from [place], [place]. Patient was being followed by PCP for diabetes management but patient will be establishing care with new PCP located here in [place]. Patient reports that she already has an appointment set up with a local doctor for this coming Monday (07/14/17) but she is not sure of the name of the doctor. Patient reports that the only DM medication she is taking is Novolog 8 units TID with meals.  Noted Actos on home medication list but patient states that she is not taking  Actos as her doctor stopped it. Noted in Care Everywhere that patient was recently inpatient at [facility] from 06/06/17 to 06/21/17 and patient was discharged on Levemir 15 units with breakfast, Levemir 10 units with supper, Humalog 7 units TID with meals, plus Humalog 0-14 units TID per correction scale for DM control. Inquired about Levemir and patient reports that she was taking Levemir as an outpatient but when she was discharged from [facility] she was told to stop taking Levemir. Explained to patient that per information noted in Care Everywhere, she should have been taking Levemir 15 units QAM with breakfast and Levemir 10 units QPM with supper. Patient stated that no one informed her of that; therefore, misunderstanding regarding Levemir. Inquired about Humalog versus Novolog she reports taking and patient confirms that she is taking Novolog and has Novolog insulin pens at home. Patient no longer has any Levemir insulin pens at home and will need Levemir insulin pens prescribed at discharge if prescribed. Patient states that she checks her glucose 2-3 times per day and it has been running high since she was discharged from [facility]. Patient states that she needs a prescription for test strips because she does not have many test strips left.  Inquired about prior A1C and patient reports that her last A1C was high.   Discussed glucose and A1C goals. Discussed importance of checking CBGs and maintaining good CBG control to prevent long-term and short-term complications. Explained how hyperglycemia leads to damage within blood vessels which lead to the common complications  seen with uncontrolled diabetes. Stressed to the patient the importance of improving glycemic control to prevent further complications from uncontrolled diabetes. Informed patient that she will likely need to resume Levemir at discharge and that a request for Levemir as an inpatient will be made today.  Patient verbalized understanding of information discussed and she states that she has no further questions at this time related to diabetes.   Thanks, Orlando Penner, RN, MSN, CDE Diabetes Coordinator Inpatient Diabetes Program [phone] (Team Pager)

## 2017-07-09 NOTE — Care Management Obs Status (Signed)
MEDICARE OBSERVATION STATUS NOTIFICATION   Patient Details  Name: Peggy Todd MRN: [id number] Date of Birth: 1951-05-01   Medicare Observation Status Notification Given:  Yes    Lawerance Sabal, RN 07/09/2017, 3:52 PM

## 2017-07-09 NOTE — Telephone Encounter (Signed)
She told us 500 mg bid so I did not change it - this was recommended by [facility], not here

## 2017-07-09 NOTE — Progress Notes (Signed)
Dr. Marland Mcalpine notified pt reported code status as DNR during admission assessment completed by Darl Pikes, RN. Pt reiterated to this RN in the presence of her daughter, that she does not want any life sustaining measures to be taken and requests code status to be DNR. MD to change code status.

## 2017-07-09 NOTE — Telephone Encounter (Signed)
Spoke with Lanora Manis. She is aware of MW's recs. Nothing else needed at time of call.

## 2017-07-09 NOTE — Progress Notes (Addendum)
Dr. Marland Mcalpine notified of course crackles auscultated throughout; inquired about continuous IVF's. Verbal order to hold fluids for now and re-evaluate tomorrow after repeat CXR. Pt updated. Will continue to monitor.

## 2017-07-09 NOTE — Progress Notes (Signed)
PROGRESS NOTE    Peggy Todd  ZOX:[id number] DOB: 03/20/1951 DOA: 07/08/2017 PCP: System, Pcp Not In   Brief Narrative:  Peggy Todd is a 66 y.o. female with medical history significant of IDDM, HTN and other comorbidities who comes in with over a day of not feeling right, feeling weak and did not take her insulin today.  Her glucose has been over 500.  Normally she runs between 200-300 range.  No cough. No fevers.  No n/v/d.  No abdominal pain or chest pain.  She has interstitial lung disease for which she has been on prednisone and cellcept for the last year. She is moving here from elsewhere and in the process of establishing care with our Pulmonology team.  Pt found to have a k level of 2.7 and sugar over 500.  Not acidotic.  Referred for admission for treatment of both issues. Currently doing slightly better. Will get PT Evaluation.   Assessment & Plan:   Principal Problem:   Hypokalemia Active Problems:   ILD (interstitial lung disease) (HCC)   Anxiety and depression   Chronic anticoagulation   Gastroesophageal reflux disease without esophagitis   History of pulmonary embolism   Essential hypertension   Type 2 diabetes mellitus with neurologic complication, with long-term current use of insulin (HCC)   Chronic respiratory failure with hypoxia (HCC)   Hyperglycemia  Hypokalemia -Patient's K+ was 2.5 this AM -Replete with IV KCl 40 mEQ, po 40 mEQ, and with NS at 125 mL/hr + 40 mEQ of KCl x 12 hours -Magnesium Level was 2.0 -Continue to Monitor and Replete as Necessary -Repeat CMP and EKG in AM   Uncontrolled Hyperglycemia in the Setting of Insulin Dependent Diabetes Mellitus Type 2 -HbA1c was 9.3 -Likely 2/2 Chronic Steroid Use for Idiopathic Interstitial Lung Disease -Diabetes Education Coordinator Consulted -Started Patient on 18 units sq Daily and Sensitive Novolog SSI AC/HS -Continue to Monitor CBG's; CBG's ranging from 327-420 -Will Add 4 units Novolog TID wm; Per  report patient takes 8 units premeal and no long acting -Hold Home Pioglitazone  -Continue to Monitor as patient is on Chronic Steroids  Generalized Weakness -C/w IVF with NS at 75 mL/hr -Urinalysis showed Many Bacteria and patient states she has had some mild burning. Check a Urine Cx -Obtain PT/OT Evaluation   Essential HTN -C/w Home Amlodipine  Hyperlipidemia -C/w Home Rosuvastatin 20 mg po Daily   COPD -Hold Home Albuterol and place on Albuterol Nebs  CAD s/p RCA with 4 Stents placed -C/w NTG -C/w Clopidogrel 75 mg po Daily and Rosuvastatin 20 mg po Daily  GERD -Was previously on Pantoprazole 40 mg po Daily but has an interaction with Cellcept -Will start Famotidine 20 mg po qHS  Depression and Anxiety -C/w Sertraline 25 mg po Daily  History of Lower Leg DVT and Submassive Pulmonary embolism in [facility] -No longer on Anticoagulation with Xarelto  Idiopathic interstitial disease (HCC) -C/w Mycophenolate 500 mg po BID Daily and with Prednisone 10 mg po Daily  Chronic respiratory failure with hypoxia (HCC)  -Wears 8-10 liters at Home per Chart Review  -Per Pulm note Maintain O2 Saturations >88% -Add Prn Albuterol Nebs -CXR showed Lungs hypoexpanded. Vascular congestion. Bilateral central airspace opacities are similar in appearance to the prior study from February, and may reflect atypical pneumonia or pulmonary edema. Would correlate with the chronicity of the patient's symptoms, to help exclude underlying chronic infectious or inflammatory process. -Repeat CXR in AM and consider CT Chest   Hyponatremia -Mild at  133 -Continue to Monitor and Repeat CMP as patient was just rehydrated  Lactic Acidosis -Improved with IVF -Lactic went from 3.68 -> 1.1  DVT prophylaxis: SCDs Code Status: FULL CODE Family Communication: No family present at bedside  Disposition Plan: Pending further work up and PT Evaluation   Consultants:   Diabetes Education Coordinator    Procedures:   None  Antimicrobials:  Anti-infectives    None     Subjective: Seen and examined and stated she felt better and was not confused. Was alert and oriented. No CP. States she is chronically SOB. No N/V. No other concerns or complaints at this time.   Objective: Vitals:   07/09/17 0033 07/09/17 0437 07/09/17 1026 07/09/17 1720  BP: 139/65 127/72 105/77 116/61  Pulse: 84 94 96 88  Resp: Temp: 98.3 F (36.8 C) 98.3 F (36.8 C) 98.1 F (36.7 C) 97.9 F (36.6 C)  TempSrc: Oral Oral Oral Oral  SpO2: 97% 98% 98% 99%  Weight: 71.7 kg (158 lb 1.1 oz)     Height: 5' (1.524 m)       Intake/Output Summary (Last 24 hours) at 07/09/17 1720 Last data filed at 07/09/17 1635  Gross per 24 hour  Intake           1377.5 ml  Output                0 ml  Net           1377.5 ml   Filed Weights   07/08/17 2026 07/09/17 0033  Weight: 71.7 kg (158 lb) 71.7 kg (158 lb 1.1 oz)   Examination: Physical Exam:  Constitutional: WN/WD Caucasian female in NAD and appears calm and comfortable Eyes: Lids and conjunctivae normal, sclerae anicteric  ENMT: External Ears, Nose appear normal. Grossly normal hearing. Mucous membranes are moist.  Neck: Appears normal, supple, no cervical masses, normal ROM, no appreciable thyromegaly. No JVD Respiratory: Diminished to auscultation bilaterally with mild rales. No wheezing, rhonchi or crackles. Normal respiratory effort and patient is not tachypenic. No accessory muscle use. Wearing Supplemental O2 via [place].  Cardiovascular: RRR, no murmurs / rubs / gallops. S1 and S2 auscultated. No extremity edema.   Abdomen: Soft, non-tender, non-distended. No masses palpated. No appreciable hepatosplenomegaly. Bowel sounds positive.  GU: Deferred. Musculoskeletal: No clubbing / cyanosis of digits/nails. No joint deformity upper and lower extremities. Good ROM, no contractures Skin: No rashes, lesions, ulcers on a limited skin eval. No induration;  Warm and dry.  Neurologic: CN 2-12 grossly intact with no focal deficits. Sensation intact in all 4 Extremities, DTR normal. Strength 5/5 in all 4. Romberg sign cerebellar reflexes not assessed.  Psychiatric: Normal judgment and insight. Alert and oriented x 3. Normal mood and appropriate affect.   Data Reviewed: I have personally reviewed following labs and imaging studies  CBC:  Recent Labs Lab 07/08/17 2028 07/09/17 0310  WBC 9.2 7.9  HGB 14.0 12.3  HCT 41.9 37.2  MCV 89.1 89.4  PLT 177 176   Basic Metabolic Panel:  Recent Labs Lab 07/08/17 2028 07/09/17 0003 07/09/17 0310  NA 127* 132* 133*  K 2.7* 2.4* 2.5*  CL 73* 79* 80*  CO2 38* 39* 39*  GLUCOSE 533* 398* 359*  BUN 21* 19 17  CREATININE 1.40* 1.22* 1.22*  CALCIUM 9.3 8.9 8.8*  MG  --  2.0  --    GFR: Estimated Creatinine Clearance: 40.1 mL/min (A) (by C-G formula based on SCr  of 1.22 mg/dL (H)). Liver Function Tests: No results for input(s): AST, ALT, ALKPHOS, BILITOT, PROT, ALBUMIN in the last 168 hours. No results for input(s): LIPASE, AMYLASE in the last 168 hours. No results for input(s): AMMONIA in the last 168 hours. Coagulation Profile: No results for input(s): INR, PROTIME in the last 168 hours. Cardiac Enzymes: No results for input(s): CKTOTAL, CKMB, CKMBINDEX, TROPONINI in the last 168 hours. BNP (last 3 results) No results for input(s): PROBNP in the last 8760 hours. HbA1C:  Recent Labs  07/09/17 0900  HGBA1C 9.3*   CBG:  Recent Labs Lab 07/08/17 2029 07/08/17 2305 07/09/17 0748 07/09/17 1156  GLUCAP 571* 420* 347* 327*   Lipid Profile: No results for input(s): CHOL, HDL, LDLCALC, TRIG, CHOLHDL, LDLDIRECT in the last 72 hours. Thyroid Function Tests: No results for input(s): TSH, T4TOTAL, FREET4, T3FREE, THYROIDAB in the last 72 hours. Anemia Panel: No results for input(s): VITAMINB12, FOLATE, FERRITIN, TIBC, IRON, RETICCTPCT in the last 72 hours. Sepsis Labs:  Recent  Labs Lab 07/08/17 2038 07/09/17 0114 07/09/17 0310  LATICACIDVEN 3.68* 1.8 1.1    No results found for this or any previous visit (from the past 240 hour(s)).   Radiology Studies: Dg Chest Portable 1 View  Result Date: 07/08/2017 CLINICAL DATA:  Acute onset of mid chest tightness, dyspnea and dry cough. Initial encounter. EXAM: PORTABLE CHEST 1 VIEW COMPARISON:  Chest radiograph performed 11/18/2016 FINDINGS: The lungs are hypoexpanded. Vascular congestion is noted. Bilateral central airspace opacities may reflect atypical pneumonia or pulmonary edema. Would correlate with the chronicity of the patient's symptoms, as this is similar in appearance to the prior study from February. No definite pleural effusion or pneumothorax is seen. The cardiomediastinal silhouette is borderline normal in size. No acute osseous abnormalities are identified. Cervical spinal fusion hardware is partially imaged. Clips are noted within the right upper quadrant, reflecting prior cholecystectomy. IMPRESSION: Lungs hypoexpanded. Vascular congestion. Bilateral central airspace opacities are similar in appearance to the prior study from February, and may reflect atypical pneumonia or pulmonary edema. Would correlate with the chronicity of the patient's symptoms, to help exclude underlying chronic infectious or inflammatory process. Electronically Signed   By: Roanna Raider M.D.   On: 07/08/2017 21:28   Scheduled Meds: . amLODipine  10 mg Oral Daily  . clopidogrel  75 mg Oral Daily  . famotidine  20 mg Oral QHS  . insulin aspart  0-5 Units Subcutaneous QHS  . insulin aspart  0-9 Units Subcutaneous TID WC  . [START ON 07/10/2017] insulin aspart  4 Units Subcutaneous TID WC  . insulin detemir  18 Units Subcutaneous Daily  . mycophenolate  500 mg Oral BID  . predniSONE  10 mg Oral Q breakfast  . pregabalin  100 mg Oral BID  . rosuvastatin  20 mg Oral Daily  . sertraline  25 mg Oral Daily   Continuous Infusions: .  sodium chloride      LOS: 0 days   Merlene Laughter, DO [facility] Pager [phone]  If 7PM-7AM, please contact night-coverage [email or web address] Password [email or web address] 07/09/2017, 5:20 PM

## 2017-07-10 ENCOUNTER — Inpatient Hospital Stay (HOSPITAL_COMMUNITY): Payer: Medicare Other

## 2017-07-10 DIAGNOSIS — E875 Hyperkalemia: Secondary | ICD-10-CM

## 2017-07-10 DIAGNOSIS — B952 Enterococcus as the cause of diseases classified elsewhere: Secondary | ICD-10-CM

## 2017-07-10 DIAGNOSIS — B9620 Unspecified Escherichia coli [E. coli] as the cause of diseases classified elsewhere: Secondary | ICD-10-CM

## 2017-07-10 DIAGNOSIS — N390 Urinary tract infection, site not specified: Secondary | ICD-10-CM

## 2017-07-10 DIAGNOSIS — R0602 Shortness of breath: Secondary | ICD-10-CM

## 2017-07-10 LAB — COMPREHENSIVE METABOLIC PANEL
ALT: 28 U/L (ref 14–54)
ANION GAP: 7 (ref 5–15)
AST: 23 U/L (ref 15–41)
Albumin: 2.4 g/dL — ABNORMAL LOW (ref 3.5–5.0)
Alkaline Phosphatase: 167 U/L — ABNORMAL HIGH (ref 38–126)
BILIRUBIN TOTAL: 0.5 mg/dL (ref 0.3–1.2)
BUN: 10 mg/dL (ref 6–20)
CO2: 33 mmol/L — ABNORMAL HIGH (ref 22–32)
Calcium: 9.2 mg/dL (ref 8.9–10.3)
Chloride: 98 mmol/L — ABNORMAL LOW (ref 101–111)
Creatinine, Ser: 0.93 mg/dL (ref 0.44–1.00)
GFR calc Af Amer: >60 mL/min (ref >60)
Glucose, Bld: 240 mg/dL — ABNORMAL HIGH (ref 65–99)
Potassium: 5.5 mmol/L — ABNORMAL HIGH (ref 3.5–5.1)
Sodium: 138 mmol/L (ref 135–145)
TOTAL PROTEIN: 5.2 g/dL — AB (ref 6.5–8.1)

## 2017-07-10 LAB — CBC WITH DIFFERENTIAL/PLATELET
BASOS ABS: 0 10*3/uL (ref 0.0–0.1)
BASOS PCT: 0 %
EOS ABS: 0.1 10*3/uL (ref 0.0–0.7)
EOS PCT: 1 %
HEMATOCRIT: 37.1 % (ref 36.0–46.0)
Hemoglobin: 11.7 g/dL — ABNORMAL LOW (ref 12.0–15.0)
LYMPHS ABS: 1.7 10*3/uL (ref 0.7–4.0)
Lymphocytes Relative: 27 %
MCH: 29.2 pg (ref 26.0–34.0)
MCHC: 31.5 g/dL (ref 30.0–36.0)
MCV: 92.5 fL (ref 78.0–100.0)
Monocytes Absolute: 0.7 10*3/uL (ref 0.1–1.0)
Monocytes Relative: 11 %
NEUTROS PCT: 61 %
Neutro Abs: 3.8 10*3/uL (ref 1.7–7.7)
Platelets: 179 10*3/uL (ref 150–400)
RBC: 4.01 MIL/uL (ref 3.87–5.11)
RDW: 16.3 % — AB (ref 11.5–15.5)
WBC: 6.2 10*3/uL (ref 4.0–10.5)

## 2017-07-10 LAB — BASIC METABOLIC PANEL
Anion gap: 12 (ref 5–15)
BUN: 10 mg/dL (ref 6–20)
CALCIUM: 8.8 mg/dL — AB (ref 8.9–10.3)
CO2: 30 mmol/L (ref 22–32)
CREATININE: 1.3 mg/dL — AB (ref 0.44–1.00)
Chloride: 93 mmol/L — ABNORMAL LOW (ref 101–111)
GFR calc non Af Amer: 42 mL/min — ABNORMAL LOW (ref >60)
GFR, EST AFRICAN AMERICAN: 48 mL/min — AB (ref >60)
Glucose, Bld: 467 mg/dL — ABNORMAL HIGH (ref 65–99)
Potassium: 4.1 mmol/L (ref 3.5–5.1)
SODIUM: 135 mmol/L (ref 135–145)

## 2017-07-10 LAB — GLUCOSE, CAPILLARY
GLUCOSE-CAPILLARY: 244 mg/dL — AB (ref 65–99)
GLUCOSE-CAPILLARY: 438 mg/dL — AB (ref 65–99)
Glucose-Capillary: 373 mg/dL — ABNORMAL HIGH (ref 65–99)
Glucose-Capillary: 485 mg/dL — ABNORMAL HIGH (ref 65–99)

## 2017-07-10 LAB — MAGNESIUM: MAGNESIUM: 2 mg/dL (ref 1.7–2.4)

## 2017-07-10 LAB — PHOSPHORUS: Phosphorus: 1.5 mg/dL — ABNORMAL LOW (ref 2.5–4.6)

## 2017-07-10 MED ORDER — INSULIN DETEMIR 100 UNIT/ML ~~LOC~~ SOLN
7.0000 [IU] | SUBCUTANEOUS | Status: AC
  Administered 2017-07-10: 7 [IU] via SUBCUTANEOUS
  Filled 2017-07-10: qty 0.07

## 2017-07-10 MED ORDER — BENZONATATE 100 MG PO CAPS
100.0000 mg | ORAL_CAPSULE | Freq: Two times a day (BID) | ORAL | Status: DC | PRN
  Administered 2017-07-10 – 2017-07-12 (×3): 100 mg via ORAL
  Filled 2017-07-10 (×4): qty 1

## 2017-07-10 MED ORDER — LEVOFLOXACIN IN D5W 500 MG/100ML IV SOLN
500.0000 mg | INTRAVENOUS | Status: DC
  Filled 2017-07-10: qty 100

## 2017-07-10 MED ORDER — IPRATROPIUM-ALBUTEROL 0.5-2.5 (3) MG/3ML IN SOLN
3.0000 mL | Freq: Three times a day (TID) | RESPIRATORY_TRACT | Status: DC
  Administered 2017-07-10 – 2017-07-11 (×4): 3 mL via RESPIRATORY_TRACT
  Filled 2017-07-10 (×4): qty 3

## 2017-07-10 MED ORDER — DEXTROSE 5 % IV SOLN
30.0000 mmol | Freq: Once | INTRAVENOUS | Status: AC
  Administered 2017-07-10: 30 mmol via INTRAVENOUS
  Filled 2017-07-10: qty 10

## 2017-07-10 MED ORDER — LEVOFLOXACIN IN D5W 500 MG/100ML IV SOLN
500.0000 mg | Freq: Once | INTRAVENOUS | Status: AC
  Administered 2017-07-10: 500 mg via INTRAVENOUS
  Filled 2017-07-10: qty 100

## 2017-07-10 MED ORDER — ENOXAPARIN SODIUM 40 MG/0.4ML ~~LOC~~ SOLN
40.0000 mg | SUBCUTANEOUS | Status: DC
  Administered 2017-07-10 – 2017-07-11 (×2): 40 mg via SUBCUTANEOUS
  Filled 2017-07-10 (×2): qty 0.4

## 2017-07-10 MED ORDER — LEVOFLOXACIN IN D5W 250 MG/50ML IV SOLN
250.0000 mg | INTRAVENOUS | Status: DC
  Administered 2017-07-11: 250 mg via INTRAVENOUS
  Filled 2017-07-10 (×2): qty 50

## 2017-07-10 MED ORDER — INSULIN DETEMIR 100 UNIT/ML ~~LOC~~ SOLN
25.0000 [IU] | Freq: Every day | SUBCUTANEOUS | Status: DC
  Administered 2017-07-11 – 2017-07-12 (×2): 25 [IU] via SUBCUTANEOUS
  Filled 2017-07-10 (×2): qty 0.25

## 2017-07-10 MED ORDER — ALBUTEROL SULFATE (2.5 MG/3ML) 0.083% IN NEBU: 2.5000 mg | INHALATION_SOLUTION | RESPIRATORY_TRACT | Status: DC | PRN

## 2017-07-10 NOTE — Progress Notes (Addendum)
Pharmacy Antibiotic Note  Peggy Todd is a 66 y.o. female with concern for UTI.  Pharmacy has been consulted for Levaquin dosing (noted with allergy to PCN). -CrCl ~ 50  Plan: -levaquin  IV x1 followed by  IV q24h -Will follow renal function, cultures and clinical progress   Height: 5' (152.4 cm) Weight: 160 lb 3.2 oz (72.7 kg) IBW/kg (Calculated) : 45.5  Temp (24hrs), Avg:98.1 F (36.7 C), Min:97.9 F (36.6 C), Max:98.3 F (36.8 C)   Recent Labs Lab 07/08/17 2028 07/08/17 2038 07/09/17 0003 07/09/17 0114 07/09/17 0310 07/10/17 0507  WBC 9.2  --   --   --  7.9 6.2  CREATININE 1.40*  --  1.22*  --  1.22* 0.93  LATICACIDVEN  --  3.68*  --  1.8 1.1  --     Estimated Creatinine Clearance: 53 mL/min (by C-G formula based on SCr of 0.93 mg/dL).    Allergies  Allergen Reactions  . Penicillins Swelling  . Codeine Other (See Comments)    INSOMNIA, m "makes me hyper"  . Atorvastatin Other (See Comments)    Generalized myalgia     Thank you for allowing pharmacy to be a part of this patient's care.  Harland German, Pharm D 07/10/2017 5:07 PM

## 2017-07-10 NOTE — Progress Notes (Signed)
Results for ALIANY, FIORENZA (MRN [id number]) as of 07/10/2017 11:03  Ref. Range 07/09/2017 07:48 07/09/2017 11:56 07/09/2017 17:10 07/09/2017 21:27 07/10/2017 08:09  Glucose-Capillary Latest Ref Range: 65 - 99 mg/dL 119 (H) 147 (H) 829 (H) 235 (H) 244 (H)  Noted that patient's blood sugars have been greater than 180 mg/dl.  Recommend increasing Levemir to 25 units daily and continue Novolog correction scale as ordered.    Smith Mince RN BSN CDE Diabetes Coordinator Pager: [phone]  8am-5pm

## 2017-07-10 NOTE — Evaluation (Signed)
Occupational Therapy Evaluation Patient Details Name: Peggy Todd MRN: [id number] DOB: 1950-12-13 Today's Date: 07/10/2017    History of Present Illness 66 y.o. female with medical history significant of IDDM, HTN comes in with over a day of not feeling right and feeling weak.   Clinical Impression   Pt reports she was independent with ADL PTA. Currently pt requires min guard assist overall for ADL and functional mobility. Pt on 6L supplemental O2 throughout short distance functional mobility in room; DOE 3/4, SpO2=58% following minimal activity, rebounded to low 90s with 3 minute seated rest break and pursed lip breathing. Pt reports her O2 sats often drop into the 50s with minimal activity at home; sometimes as low as in the 30s. Pt planning to d/c home with intermittent supervision from her daughter. Recommending HHOT for follow up to maximize independence and safety with ADL and functional mobility upon return home. Pt would benefit from continued skilled OT to address established goals.    Follow Up Recommendations  Home health OT;Supervision/Assistance - 24 hour    Equipment Recommendations  None recommended by OT    Recommendations for Other Services PT consult     Precautions / Restrictions Precautions Precautions: None Restrictions Weight Bearing Restrictions: No      Mobility Bed Mobility Overal bed mobility: Needs Assistance Bed Mobility: Supine to Sit     Supine to sit: Supervision;HOB elevated     General bed mobility comments: supervision for safety; no physical assist. increased time required, increased effort  Transfers Overall transfer level: Needs assistance Equipment used: Rolling walker (2 wheeled) Transfers: Sit to/from Stand Sit to Stand: Min guard         General transfer comment: cues for hand placement, min guard for safety    Balance Overall balance assessment: Needs assistance Sitting-balance support: Feet supported;No upper extremity  supported Sitting balance-Leahy Scale: Good     Standing balance support: Bilateral upper extremity supported Standing balance-Leahy Scale: Poor Standing balance comment: RW for support                           ADL either performed or assessed with clinical judgement   ADL Overall ADL's : Needs assistance/impaired Eating/Feeding: Set up;Sitting   Grooming: Set up;Supervision/safety;Sitting   Upper Body Bathing: Set up;Supervision/ safety;Sitting   Lower Body Bathing: Min guard;Sit to/from stand   Upper Body Dressing : Set up;Supervision/safety;Sitting   Lower Body Dressing: Min guard;Sit to/from stand   Toilet Transfer: Min guard;Ambulation;RW Toilet Transfer Details (indicate cue type and reason): Simulated by sit to stand from EOB with functional mobility in room         Functional mobility during ADLs: Min guard;Rolling walker General ADL Comments: Pt on 6L supplemental O2 throughout session; DOE 3/4, SpO2=58% following short distance functional mobility in room. Rebounded to low 90s with pursed lip breathing x3 minutes. Pt reports she has pulse oximeter at home and she often sats in the 50s and down in the 30s sometimes.     Vision         Perception     Praxis      Pertinent Vitals/Pain Pain Assessment: Faces Faces Pain Scale: Hurts little more Pain Location: L leg Pain Descriptors / Indicators: Aching;Burning Pain Intervention(s): Monitored during session;Limited activity within patient's tolerance;Repositioned     Hand Dominance     Extremity/Trunk Assessment Upper Extremity Assessment Upper Extremity Assessment: Overall WFL for tasks assessed   Lower Extremity Assessment Lower Extremity  Assessment: Defer to PT evaluation       Communication Communication Communication: No difficulties   Cognition Arousal/Alertness: Awake/alert Behavior During Therapy: WFL for tasks assessed/performed Overall Cognitive Status: Within Functional  Limits for tasks assessed                                     General Comments       Exercises     Shoulder Instructions      Home Living Family/patient expects to be discharged to:: Private residence Living Arrangements: Children Available Help at Discharge: Family;Available PRN/intermittently Type of Home: House Home Access: Stairs to enter [employer] of Steps: 4   Home Layout: One level     Bathroom Shower/Tub: Producer, television/film/video: Handicapped height     Home Equipment: Environmental consultant - 2 wheels;Walker - 4 wheels;Shower seat   Additional Comments: 4-6L home O2      Prior Functioning/Environment Level of Independence: Independent with assistive device(s)        Comments: RW for mobility        OT Problem List: Decreased strength;Decreased activity tolerance;Impaired balance (sitting and/or standing);Decreased knowledge of use of DME or AE;Decreased knowledge of precautions;Cardiopulmonary status limiting activity;Pain      OT Treatment/Interventions: Self-care/ADL training;Energy conservation;DME and/or AE instruction;Therapeutic activities;Patient/family education;Balance training    OT Goals(Current goals can be found in the care plan section) Acute Rehab OT Goals Patient Stated Goal: get better and return home OT Goal Formulation: With patient Time For Goal Achievement: 07/24/17 Potential to Achieve Goals: Good ADL Goals Pt Will Transfer to Toilet: with modified independence;ambulating;regular height toilet Pt Will Perform Toileting - Clothing Manipulation and hygiene: with modified independence;sit to/from stand Pt Will Perform Tub/Shower Transfer: Shower transfer;with modified independence;ambulating;shower seat;rolling walker Additional ADL Goal #1: Pt will gather ADL items and perform ADL with mod I. Additional ADL Goal #2: Pt will independently verbally recall 3 energy conservation strategies and utilize during ADL.   OT Frequency: Min 2X/week   Barriers to D/C:            Co-evaluation              AM-PAC PT "6 Clicks" Daily Activity     Outcome Measure Help from another person eating meals?: None Help from another person taking care of personal grooming?: A Little Help from another person toileting, which includes using toliet, bedpan, or urinal?: A Little Help from another person bathing (including washing, rinsing, drying)?: A Little Help from another person to put on and taking off regular upper body clothing?: A Little Help from another person to put on and taking off regular lower body clothing?: A Little 6 Click Score: 19   End of Session Equipment Utilized During Treatment: Rolling walker;Oxygen Nurse Communication: Mobility status;Other (comment) Manufacturing engineer)  Activity Tolerance: Patient tolerated treatment well Patient left: in chair;with call bell/phone within reach  OT Visit Diagnosis: Unsteadiness on feet (R26.81);Pain Pain - Right/Left: Left Pain - part of body: Leg                Time: 1441-1456 OT Time Calculation (min): 15 min Charges:  OT General Charges $OT Visit: 1 Visit OT Evaluation $OT Eval Moderate Complexity: 1 Mod G-Codes:     Khylon Davies A. Brett Albino, M.S., OTR/L Pager: [phone]  Gaye Alken 07/10/2017, 3:09 PM

## 2017-07-10 NOTE — Progress Notes (Signed)
Responded to [facility] consult to assist pt. With preparing AD.  AD complete. Original given to pt. And one copy for pt.'s agent, one to nurse for chart.  Chaplain available as needed. Venida Jarvis, [place], [facility], Pager [phone]

## 2017-07-10 NOTE — Progress Notes (Signed)
PT Cancellation Note  Patient Details Name: Peggy Todd MRN: [id number] DOB: 11/21/1950   Cancelled Treatment:    Reason Eval/Treat Not Completed: Other (comment).  Pt asked PT to return at another time as she is expecting the chaplain at any moment and has some business to take care of with him.  Will return as time and pt allow.   Ivar Drape 07/10/2017, 10:36 AM   Samul Dada, PT MS Acute Rehab Dept. Number: [facility] [id number] and [facility] [phone]

## 2017-07-10 NOTE — Progress Notes (Signed)
PROGRESS NOTE    Peggy Todd  XBJ:[id number] DOB: 1951-02-18 DOA: 07/08/2017 PCP: System, Pcp Not In   Brief Narrative:  Peggy Todd is a 66 y.o. female with medical history significant of IDDM, HTN and other comorbidities who comes in with over a day of not feeling right, feeling weak and did not take her insulin today.  Her glucose has been over 500.  Normally she runs between 200-300 range.  No cough. No fevers.  No n/v/d.  No abdominal pain or chest pain.  She has interstitial lung disease for which she has been on prednisone and cellcept for the last year. She is moving here from elsewhere and in the process of establishing care with our Pulmonology team.  Pt found to have a k level of 2.7 and sugar over 500. Not acidotic.  Referred for admission for treatment of both issues. Currently doing slightly better and now Hyperkalemia. Was found to have a UTI and Abx Therapy started. PT evaluated and recommending SNF. Social Worker consulted for assistance in placement.   Assessment & Plan:   Principal Problem:   Hypokalemia Active Problems:   ILD (interstitial lung disease) (HCC)   Anxiety and depression   Chronic anticoagulation   Gastroesophageal reflux disease without esophagitis   History of pulmonary embolism   Essential hypertension   Type 2 diabetes mellitus with neurologic complication, with long-term current use of insulin (HCC)   Chronic respiratory failure with hypoxia (HCC)   Hyperglycemia   UTI (urinary tract infection)   Enterococcus UTI  Hypokalemia, now Hyperkalemia -Patient's K+ was 5.5 this AM -Replete with IV KCl 40 mEQ, po 40 mEQ, and with NS at 125 mL/hr + 40 mEQ of KCl x 12 hours yesterday; Will hold Potassium Supplementation -Magnesium Level was 2.0 -Continue to Monitor  -Repeat BMP in Evening  -Repeat CMP in the AM   Uncontrolled Hyperglycemia in the Setting of Insulin Dependent Diabetes Mellitus Type 2 -HbA1c was 9.3 -Likely 2/2 Chronic Steroid Use for  Idiopathic Interstitial Lung Disease -Diabetes Education Coordinator Consulted -Started Patient on 18 units sq Daily and increased to 25 units of Levmir Daily; C/w Sensitive Novolog SSI AC/HS -Continue to Monitor CBG's; CBG's ranging from 211-373 -Will Add 4 units Novolog TID wm; Per report patient takes 8 units premeal and no long acting -Hold Home Pioglitazone  -Continue to Monitor as patient is on Chronic Steroids  Generalized Weakness -D/C'd IVF with NS at 75 mL/hr -Urinalysis showed Many Bacteria and patient states she has had some mild burning. Checked a Urine Cx and showed >=100,000 COLONIES/mL ENTEROCOCCUS FAECIUM and 30,000 COLONIES/mL ESCHERICHIA COLI  -Obtained PT/OT Evaluation and PT recommending SNF  UTI, poA -Urinalysis showed Many Bacteria  -Patient had Burning on admission -Urinalysis showed Many Bacteria -Urine Cx showed >/=100,000 CFU of Enterococcus Faecium and 30,000 Colonies of E. Coli -Will Start Levofloxacin to treat given PCN Allergy   Essential HTN -C/w Home Amlodipine 10 mg daily   Hyperlipidemia -C/w Home Rosuvastatin 20 mg po Daily   Hypophosphatemia -Patient's Phos Level was 1.5 -Replete with IV Na+Phos 30 mmol -Continue to Monitor and Replete as Necessary -Repeat Phos Level in AM   COPD -Hold Home Albuterol and place on Albuterol Nebs -Added DuoNeb 3 mL TID  CAD s/p RCA with 4 Stents placed -C/w NTG -C/w Clopidogrel 75 mg po Daily and Rosuvastatin 20 mg po Daily  GERD -Was previously on Pantoprazole 40 mg po Daily but has an interaction with Cellcept -Will start Famotidine 20 mg  po qHS  Depression and Anxiety -C/w Sertraline 25 mg po Daily  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 Vascular congestion. Bilateral central airspace opacities are similar in appearance to the prior study from February, and may reflect atypical pneumonia or pulmonary edema. Would correlate with the chronicity of the patient's symptoms, to help exclude underlying chronic infectious or inflammatory process. -Repeat CXR this AM showed Cardiomegaly with diffuse bilateral from interstitial prominence consistent CHF. Small bilateral pleural effusions. Cardiac stents noted. Prior cervical spine fusion.  Hyponatremia, improved -Was mild at 132 and improved to 138 -Continue to Monitor and Repeat CMP as patient was just rehydrated  Lactic Acidosis -Improved with IVF -Lactic went from 3.68 -> 1.1  DVT prophylaxis: SCDs; Will add Lovenox 40 mg sq q24h Code Status: FULL CODE Family Communication: No family present at bedside; Discussed with Daughter Lurena Joiner over the phone  Disposition Plan: SNF placement when medically stable   Consultants:   Diabetes Education Coordinator   Procedures:   None  Antimicrobials:  Anti-infectives    Start     Dose/Rate Route Frequency Ordered Stop   07/10/17 1800  levofloxacin (LEVAQUIN) IVPB 500 mg     500 mg 100 mL/hr over 60 Minutes Intravenous Every 24 hours 07/10/17 1708       Subjective: Seen and examined and stated she felt better but was still weak. No CP or SOB. No lightheadedness or dizziness. PT Evaluated and recommending SNF.   Objective: Vitals:   07/09/17 2128 07/10/17 0533 07/10/17 0745 07/10/17 0941  BP: 126/66 130/66  131/68  Pulse: (!) 108 93  92  Resp: Temp: 98.3 F (36.8 C) 98.1 F (36.7 C)  98.2 F (36.8 C)  TempSrc: Oral Oral  Oral  SpO2: 93% 100% 98% 99%  Weight: 72.7 kg (160 lb 3.2 oz)     Height:        Intake/Output Summary (Last 24 hours) at 07/10/17  1813 Last data filed at 07/10/17 1645  Gross per 24 hour  Intake              380 ml  Output                0 ml  Net              380 ml   Filed Weights   07/08/17 2026 07/09/17 0033 07/09/17 2128  Weight: 71.7 kg (158 lb) 71.7 kg (158 lb 1.1 oz) 72.7 kg (160 lb 3.2 oz)   Examination: Physical Exam:  Constitutional: WN/WD Caucasian female in NAD but is coughing slightly Eyes: Sclerae anicteric; Lids normal ENMT: External ears and nose appear normal. MMM Neck: Supple with in JVD Respiratory: Diminished with mild rales and crackles. Patient was slightly dyspneic but not using any accessory muscles to breathe; Wearing Supplemental O2 via [place] Cardiovascular: RRR; no m/r/g. No extremity edema Abdomen: Soft, NT, ND. Bowel sounds present  GU: Deferred Musculoskeletal: No contractures; No cyanosis Skin: Warm and Dry. No rashes or lesions on a limited skin eval Neurologic: CN 2-12 grossly intact. No appreciable focal deficits. Psychiatric:  Normal mood and affect. Intact judgement and insight  Data Reviewed: I have personally reviewed following labs and imaging studies  CBC:  Recent Labs Lab 07/08/17 2028 07/09/17 0310 07/10/17 0507  WBC 9.2 7.9 6.2  NEUTROABS  --   --  3.8  HGB 14.0 12.3 11.7*  HCT 41.9 37.2 37.1  MCV 89.1 89.4 92.5  PLT 177 176 179   Basic Metabolic Panel:  Recent Labs Lab 07/08/17 2028 07/09/17 0003 07/09/17 0310 07/10/17 0507  NA 127* 132* 133* 138  K 2.7* 2.4* 2.5* 5.5*  CL 73* 79* 80* 98*  CO2 38* 39* 39* 33*  GLUCOSE 533* 398* 359* 240*  BUN 21* CREATININE 1.40* 1.22* 1.22* 0.93  CALCIUM 9.3 8.9 8.8* 9.2  MG  --  2.0  --  2.0  PHOS  --   --   --  1.5*   GFR: Estimated Creatinine Clearance: 53 mL/min (by C-G formula based on SCr of 0.93 mg/dL). Liver Function Tests:  Recent Labs Lab 07/10/17 0507  AST 23  ALT 28  ALKPHOS 167*  BILITOT 0.5  PROT 5.2*  ALBUMIN 2.4*   No results for input(s): LIPASE, AMYLASE in the last  168 hours. No results for input(s): AMMONIA in the last 168 hours. Coagulation Profile: No results for input(s): INR, PROTIME in the last 168 hours. Cardiac Enzymes: No results for input(s): CKTOTAL, CKMB, CKMBINDEX, TROPONINI in the last 168 hours. BNP (last 3 results) No results for input(s): PROBNP in the last 8760 hours. HbA1C:  Recent Labs  07/09/17 0900  HGBA1C 9.3*   CBG:  Recent Labs Lab 07/09/17 1710 07/09/17 2127 07/10/17 0809 07/10/17 1253 07/10/17 1734  GLUCAP 211* 235* 244* 373* 485*   Lipid Profile: No results for input(s): CHOL, HDL, LDLCALC, TRIG, CHOLHDL, LDLDIRECT in the last 72 hours. Thyroid Function Tests: No results for input(s): TSH, T4TOTAL, FREET4, T3FREE, THYROIDAB in the last 72 hours. Anemia Panel: No results for input(s): VITAMINB12, FOLATE, FERRITIN, TIBC, IRON, RETICCTPCT in the last 72 hours. Sepsis Labs:  Recent Labs Lab 07/08/17 2038 07/09/17 0114 07/09/17 0310  LATICACIDVEN 3.68* 1.8 1.1    Recent Results (from the past 240 hour(s))  Urine Culture     Status: Abnormal (Preliminary result)   Collection Time: 07/09/17  4:58 PM  Result Value Ref Range Status   Specimen Description URINE, CLEAN CATCH  Final   Special Requests NONE  Final   Culture (A)  Final    >=100,000 COLONIES/mL ENTEROCOCCUS FAECIUM 30,000 COLONIES/mL ESCHERICHIA COLI    Report Status PENDING  Incomplete   Radiology Studies: Dg Chest Port 1 View  Result Date: 07/10/2017 CLINICAL DATA:  Shortness of breath. EXAM: PORTABLE CHEST 1 VIEW COMPARISON:  07/08/2017. FINDINGS: Cardiomegaly with diffuse bilateral from interstitial prominence consistent CHF. Small bilateral pleural effusions. Cardiac stents noted. Prior cervical spine fusion. IMPRESSION: Congestive heart failure with bilateral pulmonary interstitial edema and small pleural effusions. Electronically Signed   By: Maisie Fus  Register   On: 07/10/2017 11:41   Dg Chest Portable 1 View  Result Date:  07/08/2017 CLINICAL DATA:  Acute onset of mid chest tightness, dyspnea and dry cough. Initial encounter. EXAM: PORTABLE CHEST 1 VIEW COMPARISON:  Chest radiograph performed 11/18/2016 FINDINGS: The lungs are hypoexpanded. Vascular congestion is noted. Bilateral central airspace opacities may reflect atypical pneumonia or pulmonary edema. Would correlate with the chronicity of the patient's symptoms, as this is similar in appearance to the prior study from February. No definite pleural effusion  or pneumothorax is seen. The cardiomediastinal silhouette is borderline normal in size. No acute osseous abnormalities are identified. Cervical spinal fusion hardware is partially imaged. Clips are noted within the right upper quadrant, reflecting prior cholecystectomy. IMPRESSION: Lungs hypoexpanded. Vascular congestion. Bilateral central airspace opacities are similar in appearance to the prior study from February, and may reflect atypical pneumonia or pulmonary edema. Would correlate with the chronicity of the patient's symptoms, to help exclude underlying chronic infectious or inflammatory process. Electronically Signed   By: Roanna Raider M.D.   On: 07/08/2017 21:28   Scheduled Meds: . amLODipine  10 mg Oral Daily  . clopidogrel  75 mg Oral Daily  . enoxaparin (LOVENOX) injection  40 mg Subcutaneous Q24H  . famotidine  20 mg Oral QHS  . insulin aspart  0-5 Units Subcutaneous QHS  . insulin aspart  0-9 Units Subcutaneous TID WC  . insulin aspart  4 Units Subcutaneous TID WC  . [START ON 07/11/2017] insulin detemir  25 Units Subcutaneous Daily  . ipratropium-albuterol  3 mL Nebulization TID  . mycophenolate  500 mg Oral BID  . predniSONE  10 mg Oral Q breakfast  . pregabalin  100 mg Oral BID  . rosuvastatin  20 mg Oral Daily  . sertraline  25 mg Oral Daily   Continuous Infusions: . levofloxacin (LEVAQUIN) IV      LOS: 1 day   Merlene Laughter, DO [facility] Pager [phone]  If  7PM-7AM, please contact night-coverage [email or web address] Password [facility] 07/10/2017, 6:13 PM

## 2017-07-10 NOTE — Evaluation (Signed)
Physical Therapy Evaluation Patient Details Name: Royal Beirne MRN: [id number] DOB: 05-Jul-1951 Today's Date: 07/10/2017   History of Present Illness  66 y.o. female with medical history significant of IDDM, HTN comes in with over a day of not feeling right and feeling weak.  Clinical Impression  Pt is declining O2 sats with all mobility, down to 77% sitting and then stable at 80% which required return to bed.  He rplan is to continue therapy to increase endurance and monitor safety with mobility given the decline in O2.  Her levels were high for home O2, will continue on to progress with her given of limitations pre-exisiting PT evaluation from home.  Acute therapy will continue as tolerated and with close monitoring of O2 sats.    Follow Up Recommendations SNF    Equipment Recommendations  None recommended by PT    Recommendations for Other Services       Precautions / Restrictions Precautions Precautions: None Restrictions Weight Bearing Restrictions: No      Mobility  Bed Mobility Overal bed mobility: Needs Assistance Bed Mobility: Supine to Sit     Supine to sit: Supervision;HOB elevated     General bed mobility comments: supervision for safety; no physical assist. increased time required, increased effort  Transfers Overall transfer level: Needs assistance Equipment used: Rolling walker (2 wheeled) Transfers: Sit to/from Stand Sit to Stand: Min guard         General transfer comment: cues for hand placement, min guard for safety  Ambulation/Gait             General Gait Details: deferred  Stairs            Wheelchair Mobility    Modified Rankin (Stroke Patients Only)       Balance Overall balance assessment: Needs assistance Sitting-balance support: Feet supported;No upper extremity supported Sitting balance-Leahy Scale: Good     Standing balance support: Bilateral upper extremity supported Standing balance-Leahy Scale: Poor Standing  balance comment: RW for support                             Pertinent Vitals/Pain Pain Assessment: Faces Pain Score: 2  Faces Pain Scale: Hurts little more Pain Location: L leg Pain Descriptors / Indicators: Aching;Burning Pain Intervention(s): Monitored during session;Limited activity within patient's tolerance;Repositioned    Home Living Family/patient expects to be discharged to:: Private residence Living Arrangements: Children Available Help at Discharge: Family;Available PRN/intermittently Type of Home: House Home Access: Stairs to enter Entrance Stairs-Rails: Can reach both Entrance Stairs-Number of Steps: 4 Home Layout: One level Home Equipment: Walker - 2 wheels;Walker - 4 wheels;Shower seat Additional Comments: 4-6L home O2 (4 at rest and 6 at work)    Prior Function Level of Independence: Independent with assistive device(s)         Comments: RW for mobility     Hand Dominance   Dominant Hand: Right    Extremity/Trunk Assessment   Upper Extremity Assessment Upper Extremity Assessment: Overall WFL for tasks assessed    Lower Extremity Assessment Lower Extremity Assessment: Overall WFL for tasks assessed    Cervical / Trunk Assessment Cervical / Trunk Assessment: Normal  Communication   Communication: No difficulties  Cognition Arousal/Alertness: Awake/alert Behavior During Therapy: WFL for tasks assessed/performed Overall Cognitive Status: Within Functional Limits for tasks assessed  General Comments      Exercises     Assessment/Plan    PT Assessment Patient needs continued PT services  PT Problem List Decreased range of motion;Decreased activity tolerance;Decreased balance;Decreased mobility;Decreased coordination;Decreased safety awareness;Cardiopulmonary status limiting activity;Decreased skin integrity;Pain       PT Treatment Interventions DME instruction;Gait  training;Stair training;Functional mobility training;Therapeutic activities;Therapeutic exercise;Balance training;Neuromuscular re-education;Patient/family education    PT Goals (Current goals can be found in the Care Plan section)  Acute Rehab PT Goals Patient Stated Goal: get better and return home PT Goal Formulation: With patient Time For Goal Achievement: 07/24/17 Potential to Achieve Goals: Good    Frequency Min 2X/week   Barriers to discharge Other (comment) (medically unstable)      Co-evaluation               AM-PAC PT "6 Clicks" Daily Activity  Outcome Measure Difficulty turning over in bed (including adjusting bedclothes, sheets and blankets)?: A Little Difficulty moving from lying on back to sitting on the side of the bed? : A Little Difficulty sitting down on and standing up from a chair with arms (e.g., wheelchair, bedside commode, etc,.)?: A Little Help needed moving to and from a bed to chair (including a wheelchair)?: A Little Help needed walking in hospital room?: A Little Help needed climbing 3-5 steps with a railing? : A Little 6 Click Score: 18    End of Session Equipment Utilized During Treatment: Oxygen Activity Tolerance: Treatment limited secondary to medical complications (Comment) Patient left: in bed;with call bell/phone within reach;with nursing/sitter in room Nurse Communication: Mobility status PT Visit Diagnosis: Muscle weakness (generalized) (M62.81);Adult, failure to thrive (R62.7)    Time: 1230-1255 PT Time Calculation (min) (ACUTE ONLY): 25 min   Charges:   PT Evaluation $PT Eval Moderate Complexity: 1 Mod PT Treatments $Therapeutic Activity: 8-22 mins   PT G Codes:   PT G-Codes **NOT FOR INPATIENT CLASS** Functional Assessment Tool Used: AM-PAC 6 Clicks Basic Mobility    Ivar Drape 07/10/2017, 3:52 PM   Samul Dada, PT MS Acute Rehab Dept. Number: [facility] [id number] and [facility] [phone]

## 2017-07-11 LAB — COMPREHENSIVE METABOLIC PANEL
ALK PHOS: 160 U/L — AB (ref 38–126)
ALT: 28 U/L (ref 14–54)
ANION GAP: 10 (ref 5–15)
AST: 20 U/L (ref 15–41)
Albumin: 2.5 g/dL — ABNORMAL LOW (ref 3.5–5.0)
BILIRUBIN TOTAL: 0.6 mg/dL (ref 0.3–1.2)
BUN: 10 mg/dL (ref 6–20)
CALCIUM: 8.8 mg/dL — AB (ref 8.9–10.3)
CO2: 32 mmol/L (ref 22–32)
Chloride: 98 mmol/L — ABNORMAL LOW (ref 101–111)
Creatinine, Ser: 1.08 mg/dL — ABNORMAL HIGH (ref 0.44–1.00)
GFR, EST NON AFRICAN AMERICAN: 52 mL/min — AB (ref >60)
GLUCOSE: 109 mg/dL — AB (ref 65–99)
POTASSIUM: 3.3 mmol/L — AB (ref 3.5–5.1)
Sodium: 140 mmol/L (ref 135–145)
TOTAL PROTEIN: 5.5 g/dL — AB (ref 6.5–8.1)

## 2017-07-11 LAB — CBC WITH DIFFERENTIAL/PLATELET
BASOS PCT: 0 %
Basophils Absolute: 0 10*3/uL (ref 0.0–0.1)
Eosinophils Absolute: 0.1 10*3/uL (ref 0.0–0.7)
Eosinophils Relative: 1 %
HEMATOCRIT: 37.6 % (ref 36.0–46.0)
HEMOGLOBIN: 12 g/dL (ref 12.0–15.0)
LYMPHS ABS: 2.5 10*3/uL (ref 0.7–4.0)
LYMPHS PCT: 31 %
MCH: 29 pg (ref 26.0–34.0)
MCHC: 31.9 g/dL (ref 30.0–36.0)
MCV: 90.8 fL (ref 78.0–100.0)
MONO ABS: 1.4 10*3/uL — AB (ref 0.1–1.0)
MONOS PCT: 17 %
NEUTROS ABS: 4.2 10*3/uL (ref 1.7–7.7)
NEUTROS PCT: 51 %
Platelets: 201 10*3/uL (ref 150–400)
RBC: 4.14 MIL/uL (ref 3.87–5.11)
RDW: 16.3 % — AB (ref 11.5–15.5)
WBC: 8.2 10*3/uL (ref 4.0–10.5)

## 2017-07-11 LAB — GLUCOSE, CAPILLARY
GLUCOSE-CAPILLARY: 140 mg/dL — AB (ref 65–99)
GLUCOSE-CAPILLARY: 403 mg/dL — AB (ref 65–99)
GLUCOSE-CAPILLARY: 174 mg/dL — AB (ref 65–99)
Glucose-Capillary: 425 mg/dL — ABNORMAL HIGH (ref 65–99)

## 2017-07-11 LAB — URINE CULTURE

## 2017-07-11 LAB — MAGNESIUM: MAGNESIUM: 1.8 mg/dL (ref 1.7–2.4)

## 2017-07-11 LAB — PHOSPHORUS: Phosphorus: 2.1 mg/dL — ABNORMAL LOW (ref 2.5–4.6)

## 2017-07-11 MED ORDER — HYDROXYZINE HCL 25 MG PO TABS
25.0000 mg | ORAL_TABLET | Freq: Three times a day (TID) | ORAL | Status: DC | PRN
  Administered 2017-07-11: 25 mg via ORAL
  Filled 2017-07-11: qty 1

## 2017-07-11 MED ORDER — INSULIN ASPART 100 UNIT/ML ~~LOC~~ SOLN: 0.0000 [IU] | Freq: Three times a day (TID) | SUBCUTANEOUS | Status: DC

## 2017-07-11 MED ORDER — INSULIN ASPART 100 UNIT/ML ~~LOC~~ SOLN
0.0000 [IU] | Freq: Three times a day (TID) | SUBCUTANEOUS | Status: DC
  Administered 2017-07-11 (×2): 15 [IU] via SUBCUTANEOUS
  Administered 2017-07-12: 3 [IU] via SUBCUTANEOUS

## 2017-07-11 MED ORDER — SENNOSIDES-DOCUSATE SODIUM 8.6-50 MG PO TABS
1.0000 | ORAL_TABLET | Freq: Two times a day (BID) | ORAL | Status: DC
  Administered 2017-07-11 – 2017-07-12 (×2): 1 via ORAL
  Filled 2017-07-11 (×3): qty 1

## 2017-07-11 MED ORDER — ACETAMINOPHEN 325 MG PO TABS
650.0000 mg | ORAL_TABLET | Freq: Four times a day (QID) | ORAL | Status: DC | PRN
  Administered 2017-07-11: 650 mg via ORAL
  Filled 2017-07-11: qty 2

## 2017-07-11 MED ORDER — INSULIN ASPART 100 UNIT/ML ~~LOC~~ SOLN: 0.0000 [IU] | Freq: Every day | SUBCUTANEOUS | Status: DC

## 2017-07-11 MED ORDER — POTASSIUM PHOSPHATES 15 MMOLE/5ML IV SOLN
20.0000 mmol | Freq: Once | INTRAVENOUS | Status: AC
  Administered 2017-07-11: 20 mmol via INTRAVENOUS
  Filled 2017-07-11: qty 6.67

## 2017-07-11 MED ORDER — IPRATROPIUM-ALBUTEROL 0.5-2.5 (3) MG/3ML IN SOLN
3.0000 mL | Freq: Two times a day (BID) | RESPIRATORY_TRACT | Status: DC
  Administered 2017-07-11 – 2017-07-12 (×2): 3 mL via RESPIRATORY_TRACT
  Filled 2017-07-11 (×2): qty 3

## 2017-07-11 MED ORDER — POLYETHYLENE GLYCOL 3350 17 G PO PACK
17.0000 g | PACK | Freq: Two times a day (BID) | ORAL | Status: DC
  Administered 2017-07-11 – 2017-07-12 (×2): 17 g via ORAL
  Filled 2017-07-11 (×3): qty 1

## 2017-07-11 MED ORDER — POTASSIUM CHLORIDE CRYS ER 20 MEQ PO TBCR
40.0000 meq | EXTENDED_RELEASE_TABLET | Freq: Once | ORAL | Status: AC
  Administered 2017-07-11: 40 meq via ORAL
  Filled 2017-07-11: qty 2

## 2017-07-11 MED ORDER — FUROSEMIDE 40 MG PO TABS
40.0000 mg | ORAL_TABLET | Freq: Every day | ORAL | Status: DC
  Administered 2017-07-11 – 2017-07-12 (×2): 40 mg via ORAL
  Filled 2017-07-11 (×2): qty 1

## 2017-07-11 NOTE — Clinical Social Work Placement (Signed)
   CLINICAL SOCIAL WORK PLACEMENT  NOTE **DISCHARGE SATURDAY, 10/13 TO [facility] **  Date:  07/11/2017  Patient Details  Name: Peggy Todd MRN: [id number] Date of Birth: 07-06-1951  Clinical Social Work is seeking post-discharge placement for this patient at the Skilled  Nursing Facility level of care (*CSW will initial, date and re-position this form in  chart as items are completed):  Yes   Patient/family provided with [facility] Clinical Social Work Department's list of facilities offering this level of care within the geographic area requested by the patient (or if unable, by the patient's family).  Yes   Patient/family informed of their freedom to choose among providers that offer the needed level of care, that participate in Medicare, Medicaid or managed care program needed by the patient, have an available bed and are willing to accept the patient.  Yes   Patient/family informed of [facility]'s ownership interest in [facility] and [facility], as well as of the fact that they are under no obligation to receive care at these facilities.  PASRR submitted to EDS on 07/11/17     PASRR number received on 07/11/17 ([id number]  07/11/2017 )     Existing PASRR number confirmed on       FL2 transmitted to all facilities in geographic area requested by pt/family on 07/11/17     FL2 transmitted to all facilities within larger geographic area on       Patient informed that his/her managed care company has contracts with or will negotiate with certain facilities, including the following:        Yes   Patient/family informed of bed offers received.  Patient chooses bed at [facility]     Physician recommends and patient chooses bed at      Patient to be transferred to [facility] on 07/12/17.  Patient to be transferred to facility by Ambulance     Patient family notified on 07/11/17 of transfer.  Name of  family member notified:  Daughter [name] by phone [phone])     PHYSICIAN       Additional Comment:    _______________________________________________ Cristobal Goldmann, LCSW 07/11/2017, 2:21 PM

## 2017-07-11 NOTE — Progress Notes (Signed)
Physical Therapy Treatment Patient Details Name: Peggy Todd MRN: [id number] DOB: 1951-03-30 Today's Date: 07/11/2017    History of Present Illness 66 y.o. female with medical history significant of IDDM, HTN comes in with over a day of not feeling right and feeling weak.    PT Comments    Pt was seen for evaluation of her mobility with inability to walk due to O2 sat drops.  Her standing balance control is not a problem and mainly her endurance and tolerance for activity with pulmonary function limits her.  Will progress gait as she can tolerate and her medical status improves.  Focus on standing and strengthening to BLEs as tolerated.   Follow Up Recommendations  SNF     Equipment Recommendations  None recommended by PT    Recommendations for Other Services       Precautions / Restrictions Precautions Precautions: None Restrictions Weight Bearing Restrictions: No    Mobility  Bed Mobility               General bed mobility comments: up when PT arrived  Transfers Overall transfer level: Needs assistance Equipment used: Rolling walker (2 wheeled) Transfers: Sit to/from Stand Sit to Stand: Min guard;Min assist         General transfer comment: reminded hand placement  Ambulation/Gait             General Gait Details: deferred due to O2 sats declining    Stairs            Wheelchair Mobility    Modified Rankin (Stroke Patients Only)       Balance     Sitting balance-Leahy Scale: Good       Standing balance-Leahy Scale: Fair Standing balance comment: less than fair dynamically                            Cognition Arousal/Alertness: Awake/alert Behavior During Therapy: WFL for tasks assessed/performed Overall Cognitive Status: Within Functional Limits for tasks assessed                                        Exercises      General Comments General comments (skin integrity, edema, etc.): Pt was at  97% sat initially on her usual 5L O2 and in standing dropped to 82% with RW for support      Pertinent Vitals/Pain Pain Assessment: No/denies pain    Home Living                      Prior Function            PT Goals (current goals can now be found in the care plan section) Acute Rehab PT Goals Patient Stated Goal: get home Progress towards PT goals: Progressing toward goals (stood today vs only sitting with sat drop yesterday)    Frequency    Min 2X/week      PT Plan Current plan remains appropriate    Co-evaluation              AM-PAC PT "6 Clicks" Daily Activity  Outcome Measure  Difficulty turning over in bed (including adjusting bedclothes, sheets and blankets)?: A Little Difficulty moving from lying on back to sitting on the side of the bed? : A Little Difficulty sitting down on and standing up from a chair with  arms (e.g., wheelchair, bedside commode, etc,.)?: A Little Help needed moving to and from a bed to chair (including a wheelchair)?: A Little Help needed walking in hospital room?: A Little Help needed climbing 3-5 steps with a railing? : A Little 6 Click Score: 18    End of Session Equipment Utilized During Treatment: Oxygen Activity Tolerance: Treatment limited secondary to medical complications (Comment) Patient left: in chair;with call bell/phone within reach;with chair alarm set Nurse Communication: Mobility status PT Visit Diagnosis: Muscle weakness (generalized) (M62.81);Adult, failure to thrive (R62.7)     Time: 1610-9604 PT Time Calculation (min) (ACUTE ONLY): 17 min  Charges:  $Therapeutic Activity: 8-22 mins                    G Codes:  Functional Assessment Tool Used: AM-PAC 6 Clicks Basic Mobility     Ivar Drape 07/11/2017, 6:21 PM   Samul Dada, PT MS Acute Rehab Dept. Number: [facility] [id number] and [facility] [phone]

## 2017-07-11 NOTE — Clinical Social Work Note (Signed)
Clinical Social Work Assessment  Patient Details  Name: Peggy Todd MRN: [id number] Date of Birth: October 12, 1950  Date of referral:  07/11/17               Reason for consult:  Facility Placement                Permission sought to share information with:  Family Supports Permission granted to share information::  Yes, Verbal Permission Granted  Name::     [name]  Agency::     Relationship::  Daughter  Contact Information:  [phone] (cell) or [phone] (work number)  Housing/Transportation Living arrangements for the past 2 months:  Single Family Home Source of Information:  Patient, Adult Children Patient Interpreter Needed:    Criminal Activity/Legal Involvement Pertinent to Current Situation/Hospitalization:  No - Comment as needed Significant Relationships:  Adult Children Lives with:  Adult Children Do you feel safe going back to the place where you live?  No (Daughter and patient in agreement with ST rehab as she is a fall risk) Need for family participation in patient care:  Yes (Comment) (During CSW visit, patient called daughter)  Care giving concerns:  Daughter expressed that patient is a high fall risk and will benefit from rehab to help her be safer at home.  Social Worker assessment / plan:  CSW talked with patient at the bedside and daughter by phone at bedside regarding discharge plan and recommendation of ST rehab. Patient reported that she has never been to rehab before so the process explained to she and daughter (separately) and SNF list provided. Lupe Carney reported that she had already been checking on facility ratings using Medicare.gov website.  Employment status:  Retired Wellsite geologist PT Recommendations:  24 Hour Supervision Information / Referral to community resources:  Skilled Nursing Facility (Patient and daughter provided with SNF list for [employer])  Patient/Family's Response to care:  Patient and daughter did not  express any concerns regarding her care during hospitalization.  Patient/Family's Understanding of and Emotional Response to Diagnosis, Current Treatment, and Prognosis:  Daughter appeared knowledgeable regarding patient's medical conditions and treatments.  Emotional Assessment Appearance:  Appears stated age Attitude/Demeanor/Rapport:  Other (Appropriate) Affect (typically observed):  Pleasant, Appropriate, Other (Engaged in conversation) Orientation:  Oriented to Self, Oriented to Situation, Oriented to Place, Oriented to  Time Alcohol / Substance use:  Tobacco Use, Alcohol Use, Illicit Drugs (Patient reported that she quit smoking and does not drink or use illicit drugs) Psych involvement (Current and /or in the community):  No (Comment)  Discharge Needs  Concerns to be addressed:  Discharge Planning Concerns Readmission within the last 30 days:  No Current discharge risk:  None Barriers to Discharge:  No Barriers Identified   Cristobal Goldmann, LCSW 07/11/2017, 11:29 AM

## 2017-07-11 NOTE — Progress Notes (Signed)
PROGRESS NOTE    Peggy Todd  ZOX:[id number] DOB: [birth year] DOA: 07/08/2017 PCP: System, Pcp Not In   Brief Narrative:  Peggy Todd is a 66 y.o. female with medical history significant of IDDM, HTN and other comorbidities who comes in with over a day of not feeling right, feeling weak and did not take her insulin today.  Her glucose has been over 500.  Normally she runs between 200-300 range.  No cough. No fevers.  No n/v/d.  No abdominal pain or chest pain.  She has interstitial lung disease for which she has been on prednisone and cellcept for the last year. She is moving here from elsewhere and in the process of establishing care with our Pulmonology team.  Pt found to have a k level of 2.7 and sugar over 500. Not acidotic.  Referred for admission for treatment of both issues. Currently doing slightly better and now Hyperkalemia. Was found to have a UTI and Abx Therapy started. PT evaluated and recommending SNF. Social Worker consulted for assistance in placement.   Assessment & Plan:   Principal Problem:   Hypokalemia Active Problems:   ILD (interstitial lung disease) (HCC)   Anxiety and depression   Chronic anticoagulation   Gastroesophageal reflux disease without esophagitis   History of pulmonary embolism   Essential hypertension   Type 2 diabetes mellitus with neurologic complication, with long-term current use of insulin (HCC)   Chronic respiratory failure with hypoxia (HCC)   Hyperglycemia   E. coli UTI (urinary tract infection)   Enterococcus UTI  Hypokalemia -Patient's K+ was 3.3 this AM -Replete with po KCl 40 mEQ x1 and IV KPhos 20 mmol  -Magnesium Level was 1.8 -Continue to Monitor  -Repeat BMP in Evening  -Repeat CMP in the AM   Uncontrolled Hyperglycemia in the Setting of Insulin Dependent Diabetes Mellitus Type 2 -HbA1c was 9.3 -Likely 2/2 Chronic Steroid Use for Idiopathic Interstitial Lung Disease -Diabetes Education Coordinator Consulted -Started Patient  on 18 units sq Daily and increased to 25 units of Levmir Daily; Will increase Sensitive Novolog SSI AC/HS to Moderate Novolog SSI AC/HS -Continue to Monitor CBG's; CBG's ranging from 140-485 -Added 4 units Novolog TID wm; Per report patient takes 8 units premeal and no long acting -Hold Home Pioglitazone  -Continue to Monitor as patient is on Chronic Steroids  Generalized Weakness -D/C'd IVF with NS at 75 mL/hr -Urinalysis showed Many Bacteria and patient states she has had some mild burning. Checked a Urine Cx and showed >=100,000 COLONIES/mL ENTEROCOCCUS FAECIUM and 30,000 COLONIES/mL ESCHERICHIA COLI  And was pan-sensitive -Will continue IV Levofloxacin as Cx was Pan-sensitive  -Obtained PT/OT Evaluation and PT recommending SNF -Social Work consulted for SNF placement   UTI, poA -Urinalysis showed Many Bacteria  -Patient had Burning on admission -Urine Cx showed >/=100,000 CFU of Enterococcus Faecium and 30,000 Colonies of E. Coli that was pan-sensitive -Will Start Levofloxacin to treat given PCN Allergy and continue  250 mg IV q24h  Essential HTN -C/w Home Amlodipine 10 mg daily   Hyperlipidemia -C/w Home Rosuvastatin 20 mg po Daily   Hypophosphatemia -Patient's Phos Level was 2.1 -Replete with IV KPhos 20 mmol -Continue to Monitor and Replete as Necessary -Repeat Phos Level in AM   COPD -Hold Home Albuterol and place on Albuterol Nebs -Added DuoNeb 3 mL TID  CAD s/p RCA with 4 Stents placed -C/w NTG -C/w Clopidogrel 75 mg po Daily and Rosuvastatin 20 mg po Daily  GERD -Was previously on Pantoprazole 40  mg po Daily but has an interaction with Cellcept -Will start Famotidine 20 mg po qHS  Depression and Anxiety -C/w Sertraline 25 mg po Daily  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 Vascular congestion. Bilateral central airspace opacities are similar in appearance to the prior study fromFebruary, and may reflect atypical pneumonia or pulmonary edema.Would correlate with the chronicity of the patient's symptoms, to help exclude underlying chronic infectious or inflammatory process. -Repeat CXR yesterday AM showed Cardiomegaly with diffuse bilateral from interstitial prominence consistent CHF. Small bilateral pleural effusions. Cardiac stents noted. Prior cervical spine fusion. -Restarted Home Furosemide 40 mg po Daily   Hyponatremia, improved -Was mild at 132 and improved to 140 -Continue to Monitor and Repeat CMP as patient was just rehydrated  Lactic Acidosis -Improved with IVF -Lactic went from 3.68 -> 1.1  Constipation -Started patient Miralax 17 grams po BID -Started on Senna-Docusate 1 tab po BID   DVT prophylaxis: SCDs; Lovenox 40 mg sq q24h Code Status: FULL CODE Family Communication: No family present at bedside; Discussed with Daughter Lurena Joiner over the phone  Disposition Plan: SNF placement when medically stable   Consultants:   Diabetes Education Coordinator   Procedures:   None  Antimicrobials:  Anti-infectives    Start     Dose/Rate Route Frequency Ordered Stop   07/11/17 2000  Levofloxacin (LEVAQUIN) IVPB 250 mg     250 mg 50 mL/hr over 60 Minutes Intravenous Every 24 hours 07/10/17 2018     07/10/17 2030  levofloxacin (LEVAQUIN) IVPB 500 mg     500 mg 100 mL/hr over 60 Minutes Intravenous  Once 07/10/17 2018 07/10/17 2211   07/10/17 1800  levofloxacin (LEVAQUIN) IVPB 500 mg  Status:  Discontinued     500 mg 100 mL/hr over 60 Minutes Intravenous Every 24 hours 07/10/17 1708 07/10/17 2018      Subjective: Seen and examined and stated she was breathing better. No CP or SOB. States she did not sleep very well. No other complaints or concerns.  Objective: Vitals:   07/10/17 2100 07/11/17 0554 07/11/17 0734 07/11/17 1100  BP: (!) 147/61 124/72  (!) 93/59  Pulse:  95  (!) 118  Resp:  17  18  Temp: 98.3 F (36.8 C) 97.7 F (36.5 C)  98.5 F (36.9 C)  TempSrc: Oral Oral  Oral  SpO2: 98% 100% 94% 95%  Weight:      Height:        Intake/Output Summary (Last 24 hours) at 07/11/17 1315 Last data filed at 07/11/17 0900  Gross per 24 hour  Intake              720 ml  Output              301 ml  Net              419 ml   Filed Weights   07/08/17 2026 07/09/17 0033 07/09/17 2128  Weight: 71.7 kg (158 lb) 71.7 kg (158 lb 1.1 oz) 72.7 kg (160 lb 3.2 oz)   Examination: Physical Exam:  Constitutional: WN/WD Caucasian female in NAD.  Eyes: Sclerae anicteric. Lids normal ENMT: External ears and nose appear normal. MMM Neck: Supple with no JVD Respiratory: Diminished with mild rales and scattered crackles. Patient was not tachypenic or using any supplemental O2 via [place] Cardiovascular: RRR; No m/r/g. No extremity edema Abdomen: Soft, NT, ND. Bowel sounds present GU: Deferred Musculoskeletal: No contractures; No cyanosis Skin: Warm and dry. No rashes or lesions on a limited skin eval Neurologic: CN 2-12 grossly intact. No appreciable focal deficits Psychiatric: Normal mood and affect. Intact judgement and insight  Data Reviewed: I have personally reviewed following labs and imaging studies  CBC:  Recent Labs Lab 07/08/17 2028 07/09/17 0310 07/10/17 0507 07/11/17 0234  WBC 9.2 7.9 6.2 8.2  NEUTROABS  --   --  3.8 4.2  HGB 14.0 12.3 11.7* 12.0  HCT 41.9 37.2 37.1 37.6  MCV 89.1 89.4 92.5 90.8  PLT 177 176 179 201   Basic Metabolic Panel:  Recent Labs Lab 07/09/17 0003 07/09/17 0310 07/10/17 0507 07/10/17 1902 07/11/17 0234  NA 132* 133* 138 135 140  K 2.4*  2.5* 5.5* 4.1 3.3*  CL 79* 80* 98* 93* 98*  CO2 39* 39* 33* 30 32  GLUCOSE 398* 359* 240* 467* 109*  BUN CREATININE 1.22* 1.22* 0.93 1.30* 1.08*  CALCIUM 8.9 8.8* 9.2 8.8* 8.8*  MG 2.0  --  2.0  --  1.8  PHOS  --   --  1.5*  --  2.1*   GFR: Estimated Creatinine Clearance: 45.6 mL/min (A) (by C-G formula based on SCr of 1.08 mg/dL (H)). Liver Function Tests:  Recent Labs Lab 07/10/17 0507 07/11/17 0234  AST 23 20  ALT 28 28  ALKPHOS 167* 160*  BILITOT 0.5 0.6  PROT 5.2* 5.5*  ALBUMIN 2.4* 2.5*   No results for input(s): LIPASE, AMYLASE in the last 168 hours. No results for input(s): AMMONIA in the last 168 hours. Coagulation Profile: No results for input(s): INR, PROTIME in the last 168 hours. Cardiac Enzymes: No results for input(s): CKTOTAL, CKMB, CKMBINDEX, TROPONINI in the last 168 hours. BNP (last 3 results) No results for input(s): PROBNP in the last 8760 hours. HbA1C:  Recent Labs  07/09/17 0900  HGBA1C 9.3*   CBG:  Recent Labs Lab 07/10/17 1253 07/10/17 1734 07/10/17 2104 07/11/17 0745 07/11/17 1132  GLUCAP 373* 485* 438* 140* 403*   Lipid Profile: No results for input(s): CHOL, HDL, LDLCALC, TRIG, CHOLHDL, LDLDIRECT in the last 72 hours. Thyroid Function Tests: No results for input(s): TSH, T4TOTAL, FREET4, T3FREE, THYROIDAB in the last 72 hours. Anemia Panel: No results for input(s): VITAMINB12, FOLATE, FERRITIN, TIBC, IRON, RETICCTPCT in the last 72 hours. Sepsis Labs:  Recent Labs Lab 07/08/17 2038 07/09/17 0114 07/09/17 0310  LATICACIDVEN 3.68* 1.8 1.1    Recent Results (from the past 240 hour(s))  Urine Culture     Status: Abnormal   Collection Time: 07/09/17  4:58 PM  Result Value Ref Range Status   Specimen Description URINE, CLEAN CATCH  Final   Special Requests NONE  Final   Culture (A)  Final    >=100,000 COLONIES/mL ENTEROCOCCUS FAECIUM 30,000 COLONIES/mL ESCHERICHIA COLI    Report Status 07/11/2017 FINAL   Final   Organism ID, Bacteria ENTEROCOCCUS FAECIUM (A)  Final   Organism ID, Bacteria ESCHERICHIA COLI (A)  Final      Susceptibility   Escherichia coli - MIC*    AMPICILLIN <=2 SENSITIVE Sensitive     CEFAZOLIN <=4 SENSITIVE  Sensitive     CEFTRIAXONE <=1 SENSITIVE Sensitive     CIPROFLOXACIN <=0.25 SENSITIVE Sensitive     GENTAMICIN <=1 SENSITIVE Sensitive     IMIPENEM <=0.25 SENSITIVE Sensitive     NITROFURANTOIN 32 SENSITIVE Sensitive     TRIMETH/SULFA <=20 SENSITIVE Sensitive     AMPICILLIN/SULBACTAM <=2 SENSITIVE Sensitive     PIP/TAZO <=4 SENSITIVE Sensitive     Extended ESBL NEGATIVE Sensitive     * 30,000 COLONIES/mL ESCHERICHIA COLI   Enterococcus faecium - MIC*    AMPICILLIN <=2 SENSITIVE Sensitive     LEVOFLOXACIN 2 SENSITIVE Sensitive     NITROFURANTOIN <=16 SENSITIVE Sensitive     VANCOMYCIN <=0.5 SENSITIVE Sensitive     * >=100,000 COLONIES/mL ENTEROCOCCUS FAECIUM   Radiology Studies: Dg Chest Port 1 View  Result Date: 07/10/2017 CLINICAL DATA:  Shortness of breath. EXAM: PORTABLE CHEST 1 VIEW COMPARISON:  07/08/2017. FINDINGS: Cardiomegaly with diffuse bilateral from interstitial prominence consistent CHF. Small bilateral pleural effusions. Cardiac stents noted. Prior cervical spine fusion. IMPRESSION: Congestive heart failure with bilateral pulmonary interstitial edema and small pleural effusions. Electronically Signed   By: Maisie Fus  Register   On: 07/10/2017 11:41   Scheduled Meds: . amLODipine  10 mg Oral Daily  . clopidogrel  75 mg Oral Daily  . enoxaparin (LOVENOX) injection  40 mg Subcutaneous Q24H  . famotidine  20 mg Oral QHS  . furosemide  40 mg Oral Daily  . insulin aspart  0-5 Units Subcutaneous QHS  . insulin aspart  0-9 Units Subcutaneous TID WC  . insulin aspart  4 Units Subcutaneous TID WC  . insulin detemir  25 Units Subcutaneous Daily  . ipratropium-albuterol  3 mL Nebulization BID  . mycophenolate  500 mg Oral BID  . polyethylene glycol  17 g  Oral BID  . predniSONE  10 mg Oral Q breakfast  . pregabalin  100 mg Oral BID  . rosuvastatin  20 mg Oral Daily  . senna-docusate  1 tablet Oral BID  . sertraline  25 mg Oral Daily   Continuous Infusions: . levofloxacin (LEVAQUIN) IV    . potassium PHOSPHATE IVPB (mmol) 20 mmol (07/11/17 1035)    LOS: 2 days   Merlene Laughter, DO [facility] Pager [phone]  If 7PM-7AM, please contact night-coverage [email or web address] Password [email or web address] 07/11/2017, 1:15 PM

## 2017-07-11 NOTE — [place] FL2 (Signed)
[place] MEDICAID FL2 LEVEL OF CARE SCREENING TOOL     IDENTIFICATION  Patient Name: Peggy Todd Birthdate: 10-12-1950 Sex: female Admission Date (Current Location): 07/08/2017  [facility] and [place] Number:  Producer, television/film/video and Address:  [facility], [address], [place], [place] [address]      Provider Number: [id number]  Attending Physician Name and Address:  Merlene Laughter, DO  Relative Name and Phone Number:  [name] - daughter; [phone] (mobile)    Current Level of Care: Hospital Recommended Level of Care: Skilled Nursing Facility Prior Approval Number:    Date Approved/Denied:   PASRR Number:  (Submitted for PASRR 10/12 - MUST UD #[id number])  Discharge Plan: SNF    Current Diagnoses: Patient Active Problem List   Diagnosis Date Noted  . E. coli UTI (urinary tract infection) 07/10/2017  . Enterococcus UTI 07/10/2017  . Hypokalemia 07/08/2017  . Hyperglycemia   . Upper airway cough syndrome 06/28/2017  . DOE (dyspnea on exertion) 06/27/2017  . Chronic respiratory failure with hypoxia (HCC) 06/27/2017  . ILD (interstitial lung disease) (HCC) 11/06/2016  . Hypoxemia 11/06/2016  . 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 Acute respiratory failure with hypoxia (HCC) 12/26/2015  . Opioid overdose (HCC) 12/26/2015  . Acute hypoxemic respiratory failure (HCC) 10/20/2015  . Anxiety and depression 10/20/2015  . Gastroesophageal reflux disease without esophagitis 10/20/2015  . Iron deficiency anemia 10/20/2015  . Pulmonary edema 10/20/2015  . Herniated nucleus pulposus, L4-5 left 08/08/2015  . Chronic anticoagulation 07/26/2015  . Dyslipidemia 07/26/2015  . Leg DVT (deep venous thromboembolism), chronic, left (HCC) 03/03/2013  . History of pulmonary embolism 02/28/2013  . Spinal stenosis of lumbar region without neurogenic claudication 08/25/2012  . Essential hypertension 06/06/2011  . Hypothyroidism  06/06/2011  . Type 2 diabetes mellitus with neurologic complication, with long-term current use of insulin (HCC) 06/06/2011    Orientation RESPIRATION BLADDER Height & Weight     Self, Time, Situation, Place   (5 Liters 02 in hospital) Continent Weight: 160 lb 3.2 oz (72.7 kg) Height:  5' (152.4 cm)  BEHAVIORAL SYMPTOMS/MOOD NEUROLOGICAL BOWEL NUTRITION STATUS      Continent Diet (Heart healthy/carb modified)  AMBULATORY STATUS COMMUNICATION OF NEEDS Skin   Total Care (Ambulation deferred on 10/11 PT evaluation) Verbally Other (Comment) (Skin tear left elbow with thin film dressing)                       Personal Care Assistance Level of Assistance  Bathing, Feeding, Dressing Bathing Assistance: Limited assistance Feeding assistance: Limited assistance (Assistance with set-up) Dressing Assistance: Limited assistance     Functional Limitations Info  Sight, Hearing, Speech Sight Info: Adequate Hearing Info: Adequate Speech Info: Adequate    SPECIAL CARE FACTORS FREQUENCY  PT (By licensed PT), OT (By licensed OT)     PT Frequency: Evaluated 10/11 and a minimum of 2X per week therapy recommended during hospital stay OT Frequency: Evaluated 10/11 and a minimum of 2X per week therapy recommended during hospital stay            Contractures Contractures Info: Not present    Additional Factors Info  Code Status, Allergies, Insulin Sliding Scale Code Status Info: DNR Allergies Info: Penicillins, Codeine, Atorvastatin   Insulin Sliding Scale Info: 0-5 Units daily at bedtime; 0-9 Units 3X a day with meals       Current Medications (07/11/2017):  This is the current hospital active medication list  Current Facility-Administered Medications  Medication Dose Route Frequency Provider Last Rate Last Dose  . albuterol (PROVENTIL) (2.5 MG/3ML) 0.083% nebulizer solution 2.5 mg  2.5 mg Nebulization Q4H PRN Sheikh, Omair Latif, DO      . amLODipine (NORVASC) tablet 10 mg  10 mg  Oral Daily Marguerita Merles [place], DO   10 mg at 07/11/17 1037  . benzocaine (ORAJEL) 10 % mucosal gel   Mouth/Throat QID PRN Schorr, Roma Kayser, NP      . benzonatate (TESSALON) capsule 100 mg  100 mg Oral BID PRN Marguerita Merles Latif, DO   100 mg at 07/10/17 1751  . clopidogrel (PLAVIX) tablet 75 mg  75 mg Oral Daily Tarry Kos A, MD   75 mg at 07/11/17 1036  . enoxaparin (LOVENOX) injection 40 mg  40 mg Subcutaneous Q24H SheikhKateri Mc [place], DO   40 mg at 07/10/17 1750  . famotidine (PEPCID) tablet 20 mg  20 mg Oral QHS Sheikh, Kateri Mc [place], DO   20 mg at 07/10/17 2048  . furosemide (LASIX) tablet 40 mg  40 mg Oral Daily Marguerita Merles [place], DO   40 mg at 07/11/17 1036  . hydrOXYzine (ATARAX/VISTARIL) tablet 25 mg  25 mg Oral TID PRN Marguerita Merles Latif, DO      . insulin aspart (novoLOG) injection 0-5 Units  0-5 Units Subcutaneous QHS Marguerita Merles [place], [place]   5 Units at 07/10/17 2104  . insulin aspart (novoLOG) injection 0-9 Units  0-9 Units Subcutaneous TID WC Sheikh, Kateri Mc [place], DO   1 Units at 07/11/17 0700  . insulin aspart (novoLOG) injection 4 Units  4 Units Subcutaneous TID WC SheikhKateri Mc [place], DO   4 Units at 07/11/17 0700  . insulin detemir (LEVEMIR) injection 25 Units  25 Units Subcutaneous Daily Marguerita Merles [place], [place]   25 Units at 07/11/17 1037  . ipratropium-albuterol (DUONEB) 0.5-2.5 (3) MG/3ML nebulizer solution 3 mL  3 mL Nebulization BID Sheikh, Omair Latif, DO      . Levofloxacin (LEVAQUIN) IVPB 250 mg  250 mg Intravenous Q24H Sheikh, Omair Latif, DO      . mycophenolate (CELLCEPT) capsule 500 mg  500 mg Oral BID Sheikh, Omair [place], DO   500 mg at 07/11/17 1036  . nitroGLYCERIN (NITROSTAT) SL tablet 0.4 mg  0.4 mg Sublingual Q5 min PRN Sheikh, Omair Latif, DO      . potassium PHOSPHATE 20 mmol in dextrose 5 % 500 mL infusion  20 mmol Intravenous Once Sheikh, Omair Latif, DO 84 mL/hr at 07/11/17 1035 20 mmol at 07/11/17 1035  . predniSONE (DELTASONE) tablet 10 mg  10 mg  Oral Q breakfast Marguerita Merles [place], DO   10 mg at 07/11/17 0820  . pregabalin (LYRICA) capsule 100 mg  100 mg Oral BID Tarry Kos A, MD   100 mg at 07/11/17 1037  . rosuvastatin (CRESTOR) tablet 20 mg  20 mg Oral Daily Tarry Kos A, MD   20 mg at 07/11/17 1036  . sertraline (ZOLOFT) tablet 25 mg  25 mg Oral Daily Haydee [place], MD   25 mg at 07/11/17 1036     Discharge Medications: Please see discharge summary for a list of discharge medications.  Relevant Imaging Results:  Relevant Lab Results:   Additional Information ss#[id number]  Cristobal Goldmann, LCSW

## 2017-07-12 LAB — CBC WITH DIFFERENTIAL/PLATELET
BASOS PCT: 0 %
Basophils Absolute: 0 10*3/uL (ref 0.0–0.1)
EOS ABS: 0.2 10*3/uL (ref 0.0–0.7)
Eosinophils Relative: 3 %
HCT: 37.8 % (ref 36.0–46.0)
HEMOGLOBIN: 11.7 g/dL — AB (ref 12.0–15.0)
Lymphocytes Relative: 45 %
Lymphs Abs: 3.2 10*3/uL (ref 0.7–4.0)
MCH: 28.5 pg (ref 26.0–34.0)
MCHC: 31 g/dL (ref 30.0–36.0)
MCV: 92 fL (ref 78.0–100.0)
Monocytes Absolute: 1.1 10*3/uL — ABNORMAL HIGH (ref 0.1–1.0)
Monocytes Relative: 16 %
NEUTROS PCT: 36 %
Neutro Abs: 2.6 10*3/uL (ref 1.7–7.7)
Platelets: 201 10*3/uL (ref 150–400)
RBC: 4.11 MIL/uL (ref 3.87–5.11)
RDW: 16.9 % — ABNORMAL HIGH (ref 11.5–15.5)
WBC: 7.2 10*3/uL (ref 4.0–10.5)

## 2017-07-12 LAB — COMPREHENSIVE METABOLIC PANEL
ALBUMIN: 2.4 g/dL — AB (ref 3.5–5.0)
ALK PHOS: 154 U/L — AB (ref 38–126)
ALT: 27 U/L (ref 14–54)
AST: 24 U/L (ref 15–41)
Anion gap: 8 (ref 5–15)
BUN: 8 mg/dL (ref 6–20)
CALCIUM: 8.6 mg/dL — AB (ref 8.9–10.3)
CHLORIDE: 99 mmol/L — AB (ref 101–111)
CO2: 33 mmol/L — AB (ref 22–32)
CREATININE: 1.01 mg/dL — AB (ref 0.44–1.00)
GFR calc Af Amer: >60 mL/min (ref >60)
GFR calc non Af Amer: 57 mL/min — ABNORMAL LOW (ref >60)
Glucose, Bld: 142 mg/dL — ABNORMAL HIGH (ref 65–99)
Potassium: 3.8 mmol/L (ref 3.5–5.1)
SODIUM: 140 mmol/L (ref 135–145)
Total Bilirubin: 0.6 mg/dL (ref 0.3–1.2)
Total Protein: 5.2 g/dL — ABNORMAL LOW (ref 6.5–8.1)

## 2017-07-12 LAB — MAGNESIUM: Magnesium: 1.9 mg/dL (ref 1.7–2.4)

## 2017-07-12 LAB — GLUCOSE, CAPILLARY
GLUCOSE-CAPILLARY: 116 mg/dL — AB (ref 65–99)
Glucose-Capillary: 188 mg/dL — ABNORMAL HIGH (ref 65–99)

## 2017-07-12 LAB — PHOSPHORUS: Phosphorus: 2.6 mg/dL (ref 2.5–4.6)

## 2017-07-12 MED ORDER — PREDNISONE 10 MG PO TABS: 10.0000 mg | ORAL_TABLET | Freq: Every day | ORAL | Status: DC

## 2017-07-12 MED ORDER — SENNOSIDES-DOCUSATE SODIUM 8.6-50 MG PO TABS: 1.0000 | ORAL_TABLET | Freq: Two times a day (BID) | ORAL | 0 refills | Status: DC

## 2017-07-12 MED ORDER — IPRATROPIUM-ALBUTEROL 0.5-2.5 (3) MG/3ML IN SOLN: 3.0000 mL | Freq: Two times a day (BID) | RESPIRATORY_TRACT | Status: DC

## 2017-07-12 MED ORDER — BENZOCAINE 10 % MT GEL: Freq: Four times a day (QID) | OROMUCOSAL | 0 refills | Status: DC | PRN

## 2017-07-12 MED ORDER — HYDROXYZINE HCL 25 MG PO TABS: 25.0000 mg | ORAL_TABLET | Freq: Three times a day (TID) | ORAL | 0 refills | Status: DC | PRN

## 2017-07-12 MED ORDER — TRAMADOL HCL 50 MG PO TABS: ORAL_TABLET | ORAL | 0 refills | Status: DC

## 2017-07-12 MED ORDER — INSULIN ASPART 100 UNIT/ML ~~LOC~~ SOLN: 4.0000 [IU] | Freq: Three times a day (TID) | SUBCUTANEOUS | 11 refills | Status: AC

## 2017-07-12 MED ORDER — LEVOFLOXACIN 500 MG PO TABS
250.0000 mg | ORAL_TABLET | Freq: Every day | ORAL | Status: DC
  Administered 2017-07-12: 250 mg via ORAL
  Filled 2017-07-12: qty 1

## 2017-07-12 MED ORDER — INSULIN DETEMIR 100 UNIT/ML ~~LOC~~ SOLN: 25.0000 [IU] | Freq: Every day | SUBCUTANEOUS | 11 refills | Status: DC

## 2017-07-12 MED ORDER — BENZONATATE 100 MG PO CAPS: 100.0000 mg | ORAL_CAPSULE | Freq: Two times a day (BID) | ORAL | 0 refills | Status: DC | PRN

## 2017-07-12 MED ORDER — LEVOFLOXACIN 250 MG PO TABS: 250.0000 mg | ORAL_TABLET | Freq: Every day | ORAL | 0 refills | Status: DC

## 2017-07-12 MED ORDER — FAMOTIDINE 20 MG PO TABS
20.0000 mg | ORAL_TABLET | Freq: Every day | ORAL | 0 refills | Status: DC
Start: 2017-07-12 — End: 2017-10-06

## 2017-07-12 NOTE — Clinical Social Work Note (Signed)
CSW facilitated patient discharge including contacting patient family and facility to confirm patient discharge plans. Clinical information faxed to facility and family agreeable with plan. CSW arranged ambulance transport via [facility] to [facility]. RN to call report prior to discharge [phone] Room 1105).  CSW will sign off for now as social work intervention is no longer needed. Please consult [place] again if new needs arise.  Charlynn Court, CSW [phone]

## 2017-07-12 NOTE — Discharge Summary (Signed)
Physician Discharge Summary  Peggy Todd EAV:[id number] DOB: 07-Oct-1950 DOA: 07/08/2017  PCP: System, Pcp Not In  Admit date: 07/08/2017 Discharge date: 07/12/2017  Admitted From: Home Disposition:  SNF  Recommendations for Outpatient Follow-up:  1. Follow up with and establish with PCP in 1-2 weeks 2. Follow up with Pulmonary Dr. Sherene Sires in 1 week 3. Please obtain CMP/CBC, Mag, Phos in one week  Home Health: No Equipment/Devices: None    Discharge Condition: Stable CODE STATUS: DO NOT RESUSCITATE  Diet recommendation: Heart Healthy Carb Modified Diet   Brief/Interim Summary: Peggy Todd a 66 y.o.femalewith medical history significant of IDDM, HTN and other comorbidities who presented with over a day of not feeling right, feeling weak and did not take her insulin today. Her glucose has been over 500. Normally she runs between 200-300 range. No cough. No fevers. No n/v/d. No abdominal pain or chest pain. She has interstitial lung disease for which she has been on prednisone and cellcept for the last year. She is moving here from [place] and in the process of establishing care with our Pulmonology team. Pt found to have a K+ level of 2.7 and sugar over 500 in the ED and she was not acidotic. Referred for admission for treatment of both issues. Currently doing slightly better and had Hyperkalemia but improved. She was subsequently found to have a Enterococcus and E Coli UTI and Abx Therapy started. Hospitalization was complicated with Hyperglycemia and cough both of which have improved. PT evaluated and recommending SNF. Patient deemed medically stable to D/C to SNF today and she will need to follow up with PCP and Pulmonary at D/C.   Discharge Diagnoses:  Principal Problem:   Hypokalemia Active Problems:   ILD (interstitial lung disease) (HCC)   Anxiety and depression   Chronic anticoagulation   Gastroesophageal reflux disease without esophagitis   History of pulmonary  embolism   Essential hypertension   Type 2 diabetes mellitus with neurologic complication, with long-term current use of insulin (HCC)   Chronic respiratory failure with hypoxia (HCC)   Hyperglycemia   E. coli UTI (urinary tract infection)   Enterococcus UTI  Hypokalemia, improved -Patient's K+ was 3.8 this AM -Replete with po KCl 40 mEQ x1 and IV KPhos 20 mmol yesterday -Magnesium Level is 1.9 -Continue to Monitor   -Repeat CMP at SNF  Uncontrolled Hyperglycemia in the Setting of Insulin Dependent Diabetes Mellitus Type 2, improving -HbA1c was 9.3 -Likely 2/2 Chronic Steroid Use for Idiopathic Interstitial Lung Disease -Diabetes Education Coordinator Consulted -Started Patient on 18 units sq Daily and increased to 25 units of Levmir Daily; Increase Sensitive Novolog SSI AC/HS to Moderate Novolog SSI AC/HS -Continue to Monitor CBG's; CBG's ranging from 116-188 today -Added 4 units Novolog TID wm; Per report patient takes 8 units premeal and no long acting -He;d Home Pioglitazone  -Continue to Monitor as patient is on Chronic Steroids and have PCP adjust Insulin Regimen as Necessary   Generalized Weakness -D/C'd IVF with NS at 75 mL/hr -Urinalysis showed Many Bacteria and patient states she has had some mild burning. Checked a Urine Cx and showed >=100,000 COLONIES/mL ENTEROCOCCUS FAECIUM and 30,000 COLONIES/mL ESCHERICHIA COLI  And was pan-sensitive -Continued IV Levofloxacin as Cx was Pan-sensitive and changed to po to complete course for D/C -Obtained PT/OT Evaluation and PT recommending SNF -Social Work consulted for SNF placement and patient has bed at [facility]  Enterococcus and E Coli UTI, poA -Urinalysis showed Many Bacteria  -Patient  had Burning on admission -Urine Cx showed >/=100,000 CFU of Enterococcus Faecium and 30,000 Colonies of E. Coli that was pan-sensitive -Started Levofloxacin to treat given PCN Allergy and continued 250 mg  IV q24h and now transitioned to po for D/C   Essential HTN -C/w Home Amlodipine 10 mg daily   Hyperlipidemia -C/w Home Rosuvastatin 20 mg po Daily   Hypophosphatemia -Patient's Phos Level was 2.6 this AM -Continue to Monitor and Replete as Necessary -Repeat Phos Level as an outpatient   COPD/Cough -Hold Home Albuterol and place on Albuterol Nebs -C/w DuoNeb 3 mL TID -C/w Benzonatate 100 mg po BID Prn  CAD s/p RCA with 4 Stents placed -C/w NTG 0.4 mg SL q5hprn CP -C/w Clopidogrel 75 mg po Daily and Rosuvastatin 20 mg po Daily  GERD -Was previously on Pantoprazole 40 mg po Daily but has an interaction with Cellcept -C/w Famotidine 20 mg po qHS at D/C  Depression and Anxiety -C/w Sertraline 25 mg po Daily  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 Vascular congestion. Bilateral central airspace opacities are similar in appearance to the prior study fromFebruary, and may reflect atypical pneumonia or pulmonary edema.Would correlate with the chronicity of the patient's symptoms, to help exclude underlying chronic infectious or inflammatory process. -Repeat CXR 10/11 showed Cardiomegaly with diffuse bilateral from interstitial prominence consistent CHF. Small bilateral pleural effusions. Cardiac stents noted. Prior cervical spine fusion. -Restarted Home Furosemide 40 mg po Daily  -Follow up with PCP and Pulmonary at D/C  Hyponatremia, improved -Was mild at 132 and improved to 140 -Continue to Monitor and Repeat CMP as a  outpaitent   Lactic Acidosis -Improved with IVF -Lactic went from 3.68 -> 1.1 -IVF now D/C'd   Constipation -Started patient Miralax 17 grams po BID -Started on Senna-Docusate 1 tab po BID -Continue at D/C  Discharge Instructions  Discharge Instructions    Call MD for:  difficulty breathing, headache or visual disturbances    Complete by:  As directed    Call MD for:  extreme fatigue    Complete by:  As directed    Call MD for:  hives    Complete by:  As directed    Call MD for:  persistant dizziness or light-headedness    Complete by:  As directed    Call MD for:  persistant nausea and vomiting    Complete by:  As directed    Call MD for:  redness, tenderness, or signs of infection (pain, swelling, redness, odor or green/yellow discharge around incision site)    Complete by:  As directed    Call MD for:  severe uncontrolled pain    Complete by:  As directed    Call MD for:  temperature >100.4    Complete by:  As directed    Diet - low sodium heart healthy    Complete by:  As directed    Diet Carb Modified    Complete by:  As directed    Discharge instructions    Complete by:  As directed    Follow up with  PCP and Pulmonology as an outpatient. Take all medications as prescribed. If symptoms change or worsen please return to the ED for evaluation.   Increase activity slowly    Complete by:  As directed      Allergies as of 07/12/2017      Reactions   Penicillins Swelling   Codeine Other (See Comments)   INSOMNIA, m "makes me hyper"   Atorvastatin Other (See Comments)   Generalized myalgia      Medication List    STOP taking these medications   pantoprazole 40 MG tablet Commonly known as:  PROTONIX   pioglitazone 15 MG tablet Commonly known as:  ACTOS     TAKE these medications   amLODipine 10 MG tablet Commonly known as:  NORVASC One half daily What changed:  how much to take  how to take this  when to take this  additional instructions    B-12 PO Take 1 tablet by mouth daily.   benzocaine 10 % mucosal gel Commonly known as:  ORAJEL Use as directed in the mouth or throat 4 (four) times daily as needed for mouth pain.   benzonatate 100 MG capsule Commonly known as:  TESSALON Take 1 capsule (100 mg total) by mouth 2 (two) times daily as needed for cough.   clopidogrel 75 MG tablet Commonly known as:  PLAVIX Take 75 mg by mouth daily.   famotidine 20 MG tablet Commonly known as:  PEPCID Take 1 tablet (20 mg total) by mouth at bedtime.   furosemide 40 MG tablet Commonly known as:  LASIX Take 40 mg by mouth daily.   hydrOXYzine 25 MG tablet Commonly known as:  ATARAX/VISTARIL Take 1 tablet (25 mg total) by mouth 3 (three) times daily as needed for anxiety.   insulin aspart 100 UNIT/ML injection Commonly known as:  novoLOG Inject 4 Units into the skin 3 (three) times daily with meals. What changed:  how much to take  when to take this   insulin detemir 100 UNIT/ML injection Commonly known as:  LEVEMIR Inject 0.25 mLs (25 Units total) into the skin daily.   ipratropium-albuterol 0.5-2.5 (3) MG/3ML Soln Commonly known as:  DUONEB Take 3 mLs by nebulization 2 (two) times daily.   levofloxacin 250 MG tablet Commonly known as:  LEVAQUIN Take 1 tablet (250 mg total) by mouth daily.   magnesium citrate Soln Take 10 mLs by mouth daily as needed for severe constipation.   mycophenolate 500 MG tablet Commonly known as:  CELLCEPT Take 500 mg by mouth 2 (two) times daily.   nitroGLYCERIN 0.4 MG SL tablet Commonly known as:  NITROSTAT Place 0.4 mg under the tongue every 5 (five) minutes as needed for chest pain.   OXYGEN 8-10 lpm   polyethylene glycol packet Commonly known as:  MIRALAX / GLYCOLAX Take 17 g by mouth daily.   predniSONE 10 MG tablet Commonly known as:  DELTASONE Take 1 tablet (10 mg total) by mouth daily with breakfast.   pregabalin 100 MG capsule Commonly known as:  LYRICA Take 100  mg by mouth 2 (two) times daily.   PROAIR HFA 108 (90 Base) MCG/ACT inhaler Generic drug:  albuterol Inhale 2 puffs into the lungs 4 (four) times daily as needed.   rosuvastatin 20 MG tablet Commonly known as:  CRESTOR Take 20 mg by mouth daily.   senna-docusate 8.6-50 MG tablet Commonly known as:  Senokot-S Take 1 tablet by mouth 2 (two) times daily.   sertraline 25 MG tablet  Commonly known as:  ZOLOFT Take 25 mg by mouth daily.   traMADol 50 MG tablet Commonly known as:  ULTRAM 1-2 every 4 hours as needed for cough or pain What changed:  how much to take  how to take this  when to take this  additional instructions      Contact information for after-discharge care    Destination    [facility] .   Specialty:  Skilled Nursing Chief of Staff information: [address] [place] [address] [phone]             Allergies  Allergen Reactions  . Penicillins Swelling  . Codeine Other (See Comments)    INSOMNIA, m "makes me hyper"  . Atorvastatin Other (See Comments)    Generalized myalgia   Consultations:  Diabetes Education Coordinator  Procedures/Studies: Dg Chest Port 1 View  Result Date: 07/10/2017 CLINICAL DATA:  Shortness of breath. EXAM: PORTABLE CHEST 1 VIEW COMPARISON:  07/08/2017. FINDINGS: Cardiomegaly with diffuse bilateral from interstitial prominence consistent CHF. Small bilateral pleural effusions. Cardiac stents noted. Prior cervical spine fusion. IMPRESSION: Congestive heart failure with bilateral pulmonary interstitial edema and small pleural effusions. Electronically Signed   By: Maisie Fus  Register   On: 07/10/2017 11:41   Dg Chest Portable 1 View  Result Date: 07/08/2017 CLINICAL DATA:  Acute onset of mid chest tightness, dyspnea and dry cough. Initial encounter. EXAM: PORTABLE CHEST 1 VIEW COMPARISON:  Chest radiograph performed 11/18/2016 FINDINGS: The lungs are hypoexpanded.  Vascular congestion is noted. Bilateral central airspace opacities may reflect atypical pneumonia or pulmonary edema. Would correlate with the chronicity of the patient's symptoms, as this is similar in appearance to the prior study from February. No definite pleural effusion or pneumothorax is seen. The cardiomediastinal silhouette is borderline normal in size. No acute osseous abnormalities are identified. Cervical spinal fusion hardware is partially imaged. Clips are noted within the right upper quadrant, reflecting prior cholecystectomy. IMPRESSION: Lungs hypoexpanded. Vascular congestion. Bilateral central airspace opacities are similar in appearance to the prior study from February, and may reflect atypical pneumonia or pulmonary edema. Would correlate with the chronicity of the patient's symptoms, to help exclude underlying chronic infectious or inflammatory process. Electronically Signed   By: Roanna Raider M.D.   On: 07/08/2017 21:28   Subjective: Seen and examined and was feeling good and slept better. No CP or SOB. Denied any complaints or concerns and ready to go to SNF.   Discharge Exam: Vitals:   07/12/17 0740 07/12/17 0944  BP:  (!) 98/57  Pulse:  89  Resp:  18  Temp:  (!) 97.3 F (36.3 C)  SpO2: 97% 100%   Vitals:   07/11/17 2220 07/12/17 0608 07/12/17 0740 07/12/17 0944  BP: 107/60 106/66  (!) 98/57  Pulse: (!) 116 91  89  Resp: Temp: 98.2 F (36.8 C) 98.2 F (36.8 C)  (!) 97.3 F (36.3 C)  TempSrc: Oral Oral  Oral  SpO2: 98% 99% 97% 100%  Weight: 68 kg (150 lb)     Height: 5' (1.524 m)      General: Pt is alert, awake, not in acute distress Cardiovascular: RRR, S1/S2 +, no rubs, no gallops Respiratory: CTA bilaterally, no wheezing, no rhonchi Abdominal: Soft, NT, ND, bowel sounds + Extremities: no edema, no cyanosis  The results of significant diagnostics from this hospitalization (including imaging, microbiology, ancillary and laboratory) are listed  below for reference.  Microbiology: Recent Results (from the past 240 hour(s))  Urine Culture     Status: Abnormal   Collection Time: 07/09/17  4:58 PM  Result Value Ref Range Status   Specimen Description URINE, CLEAN CATCH  Final   Special Requests NONE  Final   Culture (A)  Final    >=100,000 COLONIES/mL ENTEROCOCCUS FAECIUM 30,000 COLONIES/mL ESCHERICHIA COLI    Report Status 07/11/2017 FINAL  Final   Organism ID, Bacteria ENTEROCOCCUS FAECIUM (A)  Final   Organism ID, Bacteria ESCHERICHIA COLI (A)  Final      Susceptibility   Escherichia coli - MIC*    AMPICILLIN <=2 SENSITIVE Sensitive     CEFAZOLIN <=4 SENSITIVE Sensitive     CEFTRIAXONE <=1 SENSITIVE Sensitive     CIPROFLOXACIN <=0.25 SENSITIVE Sensitive     GENTAMICIN <=1 SENSITIVE Sensitive     IMIPENEM <=0.25 SENSITIVE Sensitive     NITROFURANTOIN 32 SENSITIVE Sensitive     TRIMETH/SULFA <=20 SENSITIVE Sensitive     AMPICILLIN/SULBACTAM <=2 SENSITIVE Sensitive     PIP/TAZO <=4 SENSITIVE Sensitive     Extended ESBL NEGATIVE Sensitive     * 30,000 COLONIES/mL ESCHERICHIA COLI   Enterococcus faecium - MIC*    AMPICILLIN <=2 SENSITIVE Sensitive     LEVOFLOXACIN 2 SENSITIVE Sensitive     NITROFURANTOIN <=16 SENSITIVE Sensitive     VANCOMYCIN <=0.5 SENSITIVE Sensitive     * >=100,000 COLONIES/mL ENTEROCOCCUS FAECIUM    Labs: BNP (last 3 results) No results for input(s): BNP in the last 8760 hours. Basic Metabolic Panel:  Recent Labs Lab 07/09/17 0003 07/09/17 0310 07/10/17 0507 07/10/17 1902 07/11/17 0234 07/12/17 0533  NA 132* 133* 138 135 140 140  K 2.4* 2.5* 5.5* 4.1 3.3* 3.8  CL 79* 80* 98* 93* 98* 99*  CO2 39* 39* 33* 30 32 33*  GLUCOSE 398* 359* 240* 467* 109* 142*  BUN CREATININE 1.22* 1.22* 0.93 1.30* 1.08* 1.01*  CALCIUM 8.9 8.8* 9.2 8.8* 8.8* 8.6*  MG 2.0  --  2.0  --  1.8 1.9  PHOS  --   --  1.5*  --  2.1* 2.6   Liver Function Tests:  Recent Labs Lab 07/10/17 0507  07/11/17 0234 07/12/17 0533  AST ALT ALKPHOS 167* 160* 154*  BILITOT 0.5 0.6 0.6  PROT 5.2* 5.5* 5.2*  ALBUMIN 2.4* 2.5* 2.4*   No results for input(s): LIPASE, AMYLASE in the last 168 hours. No results for input(s): AMMONIA in the last 168 hours. CBC:  Recent Labs Lab 07/08/17 2028 07/09/17 0310 07/10/17 0507 07/11/17 0234 07/12/17 0533  WBC 9.2 7.9 6.2 8.2 7.2  NEUTROABS  --   --  3.8 4.2 2.6  HGB 14.0 12.3 11.7* 12.0 11.7*  HCT 41.9 37.2 37.1 37.6 37.8  MCV 89.1 89.4 92.5 90.8 92.0  PLT 177 176 179 201 201   Cardiac Enzymes: No results for input(s): CKTOTAL, CKMB, CKMBINDEX, TROPONINI in the last 168 hours. BNP: Invalid input(s): POCBNP CBG:  Recent Labs Lab 07/11/17 1132 07/11/17 1652 07/11/17 2025 07/12/17 0739 07/12/17 1147  GLUCAP 403* 425* 174* 116* 188*   D-Dimer No results for input(s): DDIMER in the last 72 hours. Hgb A1c No results for input(s): HGBA1C in the last 72 hours. Lipid Profile No results for input(s): CHOL, HDL, LDLCALC, TRIG, CHOLHDL, LDLDIRECT in the last 72 hours. Thyroid function studies No results for input(s): TSH, T4TOTAL, T3FREE, THYROIDAB  in the last 72 hours.  Invalid input(s): FREET3 Anemia work up No results for input(s): VITAMINB12, FOLATE, FERRITIN, TIBC, IRON, RETICCTPCT in the last 72 hours. Urinalysis    Component Value Date/Time   COLORURINE YELLOW 07/09/2017 0705   APPEARANCEUR CLEAR 07/09/2017 0705   LABSPEC 1.013 07/09/2017 0705   PHURINE 6.0 07/09/2017 0705   GLUCOSEU >=500 (A) 07/09/2017 0705   HGBUR NEGATIVE 07/09/2017 0705   BILIRUBINUR NEGATIVE 07/09/2017 0705   KETONESUR 20 (A) 07/09/2017 0705   PROTEINUR 30 (A) 07/09/2017 0705   NITRITE NEGATIVE 07/09/2017 0705   LEUKOCYTESUR NEGATIVE 07/09/2017 0705   Sepsis Labs Invalid input(s): PROCALCITONIN,  WBC,  LACTICIDVEN Microbiology Recent Results (from the past 240 hour(s))  Urine Culture     Status: Abnormal   Collection  Time: 07/09/17  4:58 PM  Result Value Ref Range Status   Specimen Description URINE, CLEAN CATCH  Final   Special Requests NONE  Final   Culture (A)  Final    >=100,000 COLONIES/mL ENTEROCOCCUS FAECIUM 30,000 COLONIES/mL ESCHERICHIA COLI    Report Status 07/11/2017 FINAL  Final   Organism ID, Bacteria ENTEROCOCCUS FAECIUM (A)  Final   Organism ID, Bacteria ESCHERICHIA COLI (A)  Final      Susceptibility   Escherichia coli - MIC*    AMPICILLIN <=2 SENSITIVE Sensitive     CEFAZOLIN <=4 SENSITIVE Sensitive     CEFTRIAXONE <=1 SENSITIVE Sensitive     CIPROFLOXACIN <=0.25 SENSITIVE Sensitive     GENTAMICIN <=1 SENSITIVE Sensitive     IMIPENEM <=0.25 SENSITIVE Sensitive     NITROFURANTOIN 32 SENSITIVE Sensitive     TRIMETH/SULFA <=20 SENSITIVE Sensitive     AMPICILLIN/SULBACTAM <=2 SENSITIVE Sensitive     PIP/TAZO <=4 SENSITIVE Sensitive     Extended ESBL NEGATIVE Sensitive     * 30,000 COLONIES/mL ESCHERICHIA COLI   Enterococcus faecium - MIC*    AMPICILLIN <=2 SENSITIVE Sensitive     LEVOFLOXACIN 2 SENSITIVE Sensitive     NITROFURANTOIN <=16 SENSITIVE Sensitive     VANCOMYCIN <=0.5 SENSITIVE Sensitive     * >=100,000 COLONIES/mL ENTEROCOCCUS FAECIUM   Time coordinating discharge: 35 minutes  SIGNED:  Merlene Laughter, DO [facility] 07/12/2017, 12:09 PM Pager [phone]  If 7PM-7AM, please contact night-coverage [email or web address] Password [email or web address]

## 2017-07-12 NOTE — Clinical Social Work Placement (Signed)
   CLINICAL SOCIAL WORK PLACEMENT  NOTE  Date:  07/12/2017  Patient Details  Name: Peggy Todd MRN: [id number] Date of Birth: 1951/03/14  Clinical Social Work is seeking post-discharge placement for this patient at the Skilled  Nursing Facility level of care (*CSW will initial, date and re-position this form in  chart as items are completed):  Yes   Patient/family provided with [place] Clinical Social Work Department's list of facilities offering this level of care within the geographic area requested by the patient (or if unable, by the patient's family).  Yes   Patient/family informed of their freedom to choose among providers that offer the needed level of care, that participate in Medicare, Medicaid or managed care program needed by the patient, have an available bed and are willing to accept the patient.  Yes   Patient/family informed of 's ownership interest in [facility] and [facility], as well as of the fact that they are under no obligation to receive care at these facilities.  PASRR submitted to EDS on 07/11/17     PASRR number received on 07/11/17 ([id number]  07/11/2017 )     Existing PASRR number confirmed on       FL2 transmitted to all facilities in geographic area requested by pt/family on 07/11/17     FL2 transmitted to all facilities within larger geographic area on       Patient informed that his/her managed care company has contracts with or will negotiate with certain facilities, including the following:        Yes   Patient/family informed of bed offers received.  Patient chooses bed at [facility]     Physician recommends and patient chooses bed at      Patient to be transferred to [facility] on 07/12/17.  Patient to be transferred to facility by Ambulance     Patient family notified on 07/12/17 of transfer.  Name of family member notified:  Daughter [name] by phone [phone])     PHYSICIAN       Additional Comment:    _______________________________________________ Margarito Liner, LCSW 07/12/2017, 2:15 PM

## 2017-07-13 NOTE — Progress Notes (Signed)
Report given to Roselyn Reef R.N.of [employer].

## 2017-07-14 ENCOUNTER — Ambulatory Visit (INDEPENDENT_AMBULATORY_CARE_PROVIDER_SITE_OTHER): Payer: Medicare Other | Admitting: Family Medicine

## 2017-07-14 ENCOUNTER — Encounter: Payer: Self-pay | Admitting: Family Medicine

## 2017-07-14 VITALS — BP 106/64 | HR 109 | Temp 97.4°F | Ht 60.0 in | Wt 153.5 lb

## 2017-07-14 DIAGNOSIS — F419 Anxiety disorder, unspecified: Secondary | ICD-10-CM

## 2017-07-14 DIAGNOSIS — M5442 Lumbago with sciatica, left side: Secondary | ICD-10-CM

## 2017-07-14 DIAGNOSIS — J849 Interstitial pulmonary disease, unspecified: Secondary | ICD-10-CM

## 2017-07-14 DIAGNOSIS — G8929 Other chronic pain: Secondary | ICD-10-CM | POA: Diagnosis not present

## 2017-07-14 DIAGNOSIS — R630 Anorexia: Secondary | ICD-10-CM | POA: Diagnosis not present

## 2017-07-14 DIAGNOSIS — R739 Hyperglycemia, unspecified: Secondary | ICD-10-CM | POA: Diagnosis not present

## 2017-07-14 DIAGNOSIS — E1140 Type 2 diabetes mellitus with diabetic neuropathy, unspecified: Secondary | ICD-10-CM | POA: Diagnosis not present

## 2017-07-14 DIAGNOSIS — F329 Major depressive disorder, single episode, unspecified: Secondary | ICD-10-CM

## 2017-07-14 DIAGNOSIS — I10 Essential (primary) hypertension: Secondary | ICD-10-CM | POA: Diagnosis not present

## 2017-07-14 DIAGNOSIS — Z794 Long term (current) use of insulin: Secondary | ICD-10-CM | POA: Diagnosis not present

## 2017-07-14 DIAGNOSIS — Z7901 Long term (current) use of anticoagulants: Secondary | ICD-10-CM | POA: Diagnosis not present

## 2017-07-14 DIAGNOSIS — Z7689 Persons encountering health services in other specified circumstances: Secondary | ICD-10-CM | POA: Diagnosis not present

## 2017-07-14 LAB — CBC
HCT: 42.2 % (ref 36.0–46.0)
HEMOGLOBIN: 13.7 g/dL (ref 12.0–15.0)
MCHC: 32.3 g/dL (ref 30.0–36.0)
MCV: 92.8 fl (ref 78.0–100.0)
PLATELETS: 251 10*3/uL (ref 150.0–400.0)
RBC: 4.55 Mil/uL (ref 3.87–5.11)
RDW: 17 % — ABNORMAL HIGH (ref 11.5–15.5)
WBC: 11.2 10*3/uL — ABNORMAL HIGH (ref 4.0–10.5)

## 2017-07-14 LAB — PHOSPHORUS: PHOSPHORUS: 2.7 mg/dL (ref 2.3–4.6)

## 2017-07-14 LAB — COMPREHENSIVE METABOLIC PANEL
ALBUMIN: 3.6 g/dL (ref 3.5–5.2)
ALT: 32 U/L (ref 0–35)
AST: 40 U/L — AB (ref 0–37)
Alkaline Phosphatase: 156 U/L — ABNORMAL HIGH (ref 39–117)
BILIRUBIN TOTAL: 0.5 mg/dL (ref 0.2–1.2)
BUN: 16 mg/dL (ref 6–23)
CALCIUM: 9.7 mg/dL (ref 8.4–10.5)
CHLORIDE: 95 meq/L — AB (ref 96–112)
CO2: 34 mEq/L — ABNORMAL HIGH (ref 19–32)
CREATININE: 0.89 mg/dL (ref 0.40–1.20)
GFR: 67.39 mL/min (ref >60.00)
Glucose, Bld: 222 mg/dL — ABNORMAL HIGH (ref 70–99)
Potassium: 4.1 mEq/L (ref 3.5–5.1)
Sodium: 140 mEq/L (ref 135–145)
Total Protein: 6.8 g/dL (ref 6.0–8.3)

## 2017-07-14 LAB — MAGNESIUM: MAGNESIUM: 2 mg/dL (ref 1.5–2.5)

## 2017-07-14 NOTE — Patient Instructions (Addendum)
It was a pleasure to meet you today! I look forward to partnering with you for your health care needs   Try to take your hydroxyzine at bedtime to help with sleep  Take tramadol 2 tablets at onset of pain and before activities that increase pain  We are checking complete metabolic panel today

## 2017-07-14 NOTE — Progress Notes (Signed)
Subjective:    Patient ID: Peggy Todd, female    DOB: 1951-04-10, 66 y.o.   MRN: [id number]  HPI This is a 66 yo female who presents today to establish care and for hospital follow up. She is accompanied by her house mate [name]. She recently moved to [place] from [place] so her daughter can help with her care. She was living with her other daughter in [place]. Her husband died in 02/23/2015. She is very limited in activity by her lung disease, she watches TV and enjoys going to church.   She was admitted to [facility] hospital from 07/08/17-07/12/17 with hypokalemia, interstitial lung disease, hyperglycemia, chronic resp failure with hypoxia. E. Coli UTI, enterococcus UTI. Her HgbA1C was 9.3 and she was discharged on SSI and Levimir. She was discharged to [facility].   Doesn't feel like she is getting good care there (slow to answer call bells and get her up) and her daughter is looking to mover her. Her daughter is working with a Child psychotherapist. She is on 5l oxygen. Blood sugars doing well, 100s. She has not started PT.   ILD- currently stable on 5L O2. Gets very winded with activity. No sputum production. Was discharged on Levaquin. Always has dry cough when her oxygen is low. Feels really weak. Will be getting PT, not sure how long she is expected to be there. Breathing currently ok for her, oxygenation drops with activity.  Back pain and sciatica- long standing issure. Pain center to left side, down leg to foot. Pain constant. Pain is sharp with numbness and tingling, interferes with sleep. Takes lyrica without relief. Is prescribed tramadol and requests infrequently from SNF staff.   Sleep- poor sleep between being in SNF and back pain. Has hydroxyzine prescribed PRN, but has not been requesting.   Appetite- decreased since hospitalization. Has been drinking Ensure which she doesn't mind.   Past Medical History:  Diagnosis Date  . Asthma   . Depression   .  Diabetic peripheral angiopathy (HCC)   . Dvt femoral (deep venous thrombosis) (HCC) ?2015   LLE  . GERD (gastroesophageal reflux disease)   . High cholesterol   . History of stomach ulcers   . Hypertension   . On home oxygen therapy "   "4-5L; high w/activity" 07/09/2017)  . Pneumonia 23-Feb-2016  . Pulmonary embolism (HCC) ?2015  . Type II diabetes mellitus (HCC)    Past Surgical History:  Procedure Laterality Date  . APPENDECTOMY    . BACK SURGERY    . CORONARY ANGIOPLASTY WITH STENT PLACEMENT  12/2016   "4 stents"  . ESOPHAGOGASTRODUODENOSCOPY (EGD) WITH ESOPHAGEAL DILATION  X 2  . LAPAROSCOPIC CHOLECYSTECTOMY    . LUMBAR DISC SURGERY    . TOTAL ABDOMINAL HYSTERECTOMY    . TUBAL LIGATION     Family History  Problem Relation Age of Onset  . Heart disease Mother   . Stroke Mother   . Heart disease Father    Social History  Substance Use Topics  . Smoking status: Former Smoker    Years: 10.00    Types: Cigarettes    Quit date: 50  . Smokeless tobacco: Never Used  . Alcohol use No    Outpatient Encounter Prescriptions as of 07/14/2017  Medication Sig  . albuterol (PROAIR HFA) 108 (90 Base) MCG/ACT inhaler Inhale 2 puffs into the lungs 4 (four) times daily as needed.  Marland Kitchen amLODipine (NORVASC) 10 MG tablet One half daily (Patient taking differently: Take  10 mg by mouth daily. One half daily)  . clopidogrel (PLAVIX) 75 MG tablet Take 75 mg by mouth daily.  . Cyanocobalamin (B-12 PO) Take 1 tablet by mouth daily.  . famotidine (PEPCID) 20 MG tablet Take 1 tablet (20 mg total) by mouth at bedtime.  . furosemide (LASIX) 40 MG tablet Take 40 mg by mouth daily.  . hydrOXYzine (ATARAX/VISTARIL) 25 MG tablet Take 1 tablet (25 mg total) by mouth 3 (three) times daily as needed for anxiety.  . insulin aspart (NOVOLOG) 100 UNIT/ML injection Inject 4 Units into the skin 3 (three) times daily with meals.  . insulin detemir (LEVEMIR) 100 UNIT/ML injection Inject 0.25 mLs (25 Units total)  into the skin daily.  Marland Kitchen ipratropium-albuterol (DUONEB) 0.5-2.5 (3) MG/3ML SOLN Take 3 mLs by nebulization 2 (two) times daily.  Marland Kitchen levofloxacin (LEVAQUIN) 250 MG tablet Take 1 tablet (250 mg total) by mouth daily.  . magnesium citrate SOLN Take 10 mLs by mouth daily as needed for severe constipation.  . mycophenolate (CELLCEPT) 500 MG tablet Take 500 mg by mouth 2 (two) times daily.  . nitroGLYCERIN (NITROSTAT) 0.4 MG SL tablet Place 0.4 mg under the tongue every 5 (five) minutes as needed for chest pain.  . OXYGEN 8-10 lpm  . pantoprazole (PROTONIX) 40 MG tablet Take by mouth.  . polyethylene glycol (MIRALAX / GLYCOLAX) packet Take 17 g by mouth daily.  . predniSONE (DELTASONE) 10 MG tablet Take 1 tablet (10 mg total) by mouth daily with breakfast.  . pregabalin (LYRICA) 100 MG capsule Take 100 mg by mouth 2 (two) times daily.   . rosuvastatin (CRESTOR) 20 MG tablet Take 20 mg by mouth daily.  Marland Kitchen senna-docusate (SENOKOT-S) 8.6-50 MG tablet Take 1 tablet by mouth 2 (two) times daily.  . sertraline (ZOLOFT) 25 MG tablet Take 25 mg by mouth daily.  . simvastatin (ZOCOR) 40 MG tablet Take by mouth.  . traMADol (ULTRAM) 50 MG tablet 1-2 every 4 hours as needed for cough or pain  . [DISCONTINUED] benzocaine (ORAJEL) 10 % mucosal gel Use as directed in the mouth or throat 4 (four) times daily as needed for mouth pain.  . [DISCONTINUED] benzonatate (TESSALON) 100 MG capsule Take 1 capsule (100 mg total) by mouth 2 (two) times daily as needed for cough.   No facility-administered encounter medications on file as of 07/14/2017.      Review of Systems Per HPI    Objective:   Physical Exam  Constitutional: She is oriented to person, place, and time.  Sitting in wheelchair, appears frail.   HENT:  Head: Normocephalic and atraumatic.  Eyes: Conjunctivae are normal.  Cardiovascular: Normal rate, regular rhythm and normal heart sounds.   Pulmonary/Chest: Effort normal. No respiratory distress. She  has no wheezes. She has no rales.  Generally decreased throughout.   Musculoskeletal: She exhibits no edema.  Neurological: She is alert and oriented to person, place, and time.  Skin: Skin is warm and dry.  Psychiatric: She has a normal mood and affect. Her behavior is normal. Judgment and thought content normal.  Vitals reviewed.     BP 106/64 (BP Location: Right Arm, Patient Position: Sitting, Cuff Size: Normal)   Pulse (!) 109   Temp (!) 97.4 F (36.3 C) (Oral)   Ht 5' (1.524 m)   Wt 153 lb 8 oz (69.6 kg)   SpO2 90%   BMI 29.98 kg/m  BP Readings from Last 3 Encounters:  07/14/17 106/64  07/12/17 (!) 98/57  06/27/17 106/60   Wt Readings from Last 3 Encounters:  07/14/17 153 lb 8 oz (69.6 kg)  07/11/17 150 lb (68 kg)  06/27/17 158 lb (71.7 kg)      Assessment & Plan:  1. Encounter to establish care - follow up in 4 weeks - will request records from providers in [place], [place], record release obtained  2. ILD (interstitial lung disease) (HCC) - she has follow up in pulmonary in 2 weeks, they are planning to do an extensive medication review  - maintain current levels of O2 to maintain pulse ox 88% or greater per pulmonary - CBC - Comprehensive metabolic panel - Magnesium - Phosphorus  3. Essential hypertension - currently well controlled - Comprehensive metabolic panel - Magnesium - Phosphorus  4. Type 2 diabetes mellitus with diabetic neuropathy, with long-term current use of insulin (HCC) - continue current insulin regimen at SNF, may need endocrine referral after discharge from SNF - CBC - Comprehensive metabolic panel - Magnesium - Phosphorus  5. Hyperglycemia - see #4  6. Chronic anticoagulation - no current sign/symptoms of bleeding - CBC  7. Anxiety and depression - reports significant depression since husband died two years ago, continue sertraline, consider therapy when stable and discharged from SNF - encouraged her to maximize sleep  potential (use hydroxyzine, ear plugs, eye mask  8. Chronic midline low back pain with left-sided sciatica - she has tramadol ordered, encouraged her to take 1-2 at onset of pain and not to let get severe before treating  9. Decreased appetite - encouraged her to eat protein at every meal and snack, drink Ensure or other high protein beverage  Over 45 minutes were spent face-to-face with the patient during this encounter and >50% of that time was spent on counseling and coordination of care  Olean Ree, FNP-BC  [facility], [facility]  07/14/2017 8:18 PM

## 2017-07-15 NOTE — Telephone Encounter (Signed)
Rec'd completed forms - fwd to [employer] via interoffice mail  - 07/15/17 -pr

## 2017-07-21 ENCOUNTER — Telehealth: Payer: Self-pay | Admitting: Internal Medicine

## 2017-07-21 ENCOUNTER — Telehealth: Payer: Self-pay | Admitting: Adult Health

## 2017-07-21 NOTE — Telephone Encounter (Signed)
Forms are in MW's lookat  

## 2017-07-21 NOTE — Telephone Encounter (Signed)
Spoke to patient's daughter. She stated that her [facility]. She has an appt with TP on 10/29 but isn't sure if her mother will be discharged by then. The appointment with TP was for her medication. She would to cancel the appt. And wait her mother is discharged.   Advised her that her I could cancel the appointment and for her to call back once she has been discharged.   Nothing else needed at time of call.

## 2017-07-22 NOTE — Telephone Encounter (Signed)
This was given back to StevinsonPatrice, thanks

## 2017-07-22 NOTE — Telephone Encounter (Signed)
Done

## 2017-07-23 NOTE — Telephone Encounter (Signed)
Rec'd completed forms back - fwd to [employer] via interoffice mail -pr  °

## 2017-07-28 ENCOUNTER — Encounter: Payer: Medicare Other | Admitting: Adult Health

## 2017-08-04 ENCOUNTER — Telehealth: Payer: Self-pay | Admitting: Family Medicine

## 2017-08-04 NOTE — Telephone Encounter (Signed)
Copied from CRM [phone]. Topic: Quick Communication - See Telephone Encounter >> Aug 04, 2017  2:54 PM Lilia Proeid, Karysha J wrote: CRM for notification. See Telephone encounter for: Verbal orders needed for home healthcare requesting occupation therapy 2x week/3 weeks   Please call [phone] VM ok- secure line 08/04/17.

## 2017-08-04 NOTE — Telephone Encounter (Signed)
Called and left message on [facility] voicemail for verbal orders for OT per NP Leone PayorGessner. Nothing further needed at this time.

## 2017-08-04 NOTE — Telephone Encounter (Signed)
Please call and leave message that I ok HH as listed.

## 2017-08-05 ENCOUNTER — Encounter: Payer: Self-pay | Admitting: Family Medicine

## 2017-08-05 ENCOUNTER — Encounter: Payer: Self-pay | Admitting: Internal Medicine

## 2017-08-05 NOTE — Telephone Encounter (Signed)
Copied from CRM #[id number]. Topic: Quick Communication - See Telephone Encounter >> Aug 05, 2017 11:35 AM [phone] Hainesaylor, Brittany L wrote: CRM for notification. See Telephone encounter for:   [facility] and physical therapy. Could someone please call Lorrin Jacksonicolette Brown [phone] 08/05/17.

## 2017-08-06 NOTE — Telephone Encounter (Signed)
Yes, thank you.

## 2017-08-06 NOTE — Telephone Encounter (Signed)
Called and [facility] and PT per NP Leone PayorGessner. Nothing further needed at this time.

## 2017-08-06 NOTE — Telephone Encounter (Signed)
Verbal orders okay

## 2017-08-08 ENCOUNTER — Encounter: Payer: Self-pay | Admitting: Family Medicine

## 2017-08-12 ENCOUNTER — Telehealth: Payer: Self-pay | Admitting: Internal Medicine

## 2017-08-12 NOTE — Telephone Encounter (Signed)
Spoke with Clydie BraunKaren from [facility], states that pt's O2 is dropping to the 40s with any speaking.  Also notes that pt is unable to take her inhaled medications d/t causing a cough so violent that her O2 will drop low as well.  I advised Clydie BraunKaren that if pt's sats are dropping to the 40s that she needs to be evaluated in the ED.  Clydie BraunKaren expressed understanding, will arrange transport for pt.  Nothing further needed.

## 2017-08-14 ENCOUNTER — Encounter: Payer: Self-pay | Admitting: Internal Medicine

## 2017-08-14 ENCOUNTER — Encounter: Payer: Self-pay | Admitting: Family Medicine

## 2017-08-15 ENCOUNTER — Other Ambulatory Visit: Payer: Self-pay | Admitting: Family Medicine

## 2017-08-15 ENCOUNTER — Ambulatory Visit: Payer: Medicare Other | Admitting: Internal Medicine

## 2017-08-15 ENCOUNTER — Telehealth: Payer: Self-pay | Admitting: Internal Medicine

## 2017-08-15 MED ORDER — INSULIN DETEMIR 100 UNIT/ML ~~LOC~~ SOLN: 25.0000 [IU] | Freq: Every day | SUBCUTANEOUS | 11 refills | Status: DC

## 2017-08-15 NOTE — Telephone Encounter (Signed)
Pt is requesting Tramadol refill.  Last refilled by MW on 06-27-17 # 40. It appears that Dr. Marland McalpineSheikh sent in for #10 on 07-12-17  MW please advise.

## 2017-08-15 NOTE — Telephone Encounter (Signed)
Spoke with the pt's caregiver and scheduled appt

## 2017-08-15 NOTE — Telephone Encounter (Signed)
Spoke with Boykin ReaperKindrea, pt's PT  She states that the pt is having a lot of nasal congestion and having decreased o2 sats  She tried putting her [place] in her mouth and sats improved  She is asking if we should refer to ENT for eval of possible nasal obstruction  I advised that we need to see the pt, she has cancelled her last 2 appts  She asked that we call the pt to schedule this  I called the pt and [facility] to [facility]

## 2017-08-16 MED ORDER — TRAMADOL HCL 50 MG PO TABS: ORAL_TABLET | ORAL | 0 refills | Status: DC

## 2017-08-18 ENCOUNTER — Encounter: Payer: Self-pay | Admitting: Family Medicine

## 2017-08-18 ENCOUNTER — Encounter: Payer: Self-pay | Admitting: Internal Medicine

## 2017-08-18 ENCOUNTER — Ambulatory Visit (INDEPENDENT_AMBULATORY_CARE_PROVIDER_SITE_OTHER): Payer: Medicare Other | Admitting: Internal Medicine

## 2017-08-18 ENCOUNTER — Ambulatory Visit (INDEPENDENT_AMBULATORY_CARE_PROVIDER_SITE_OTHER): Payer: Medicare Other | Admitting: Family Medicine

## 2017-08-18 VITALS — BP 102/60 | HR 121 | Ht 60.0 in | Wt 149.0 lb

## 2017-08-18 VITALS — BP 118/62 | HR 109 | Temp 98.9°F | Wt 151.0 lb

## 2017-08-18 DIAGNOSIS — E1140 Type 2 diabetes mellitus with diabetic neuropathy, unspecified: Secondary | ICD-10-CM | POA: Diagnosis not present

## 2017-08-18 DIAGNOSIS — J849 Interstitial pulmonary disease, unspecified: Secondary | ICD-10-CM

## 2017-08-18 DIAGNOSIS — Z794 Long term (current) use of insulin: Secondary | ICD-10-CM

## 2017-08-18 DIAGNOSIS — J9611 Chronic respiratory failure with hypoxia: Secondary | ICD-10-CM | POA: Insufficient documentation

## 2017-08-18 DIAGNOSIS — J9612 Chronic respiratory failure with hypercapnia: Secondary | ICD-10-CM

## 2017-08-18 DIAGNOSIS — Z7901 Long term (current) use of anticoagulants: Secondary | ICD-10-CM

## 2017-08-18 DIAGNOSIS — I10 Essential (primary) hypertension: Secondary | ICD-10-CM

## 2017-08-18 DIAGNOSIS — R05 Cough: Secondary | ICD-10-CM

## 2017-08-18 DIAGNOSIS — R058 Other specified cough: Secondary | ICD-10-CM

## 2017-08-18 MED ORDER — PREGABALIN 100 MG PO CAPS: 100.0000 mg | ORAL_CAPSULE | Freq: Two times a day (BID) | ORAL | 1 refills | Status: DC

## 2017-08-18 MED ORDER — PANTOPRAZOLE SODIUM 40 MG PO TBEC: DELAYED_RELEASE_TABLET | ORAL | 2 refills | Status: DC

## 2017-08-18 MED ORDER — SERTRALINE HCL 25 MG PO TABS: 25.0000 mg | ORAL_TABLET | Freq: Every day | ORAL | 1 refills | Status: DC

## 2017-08-18 MED ORDER — FLUTTER DEVI: 0 refills | Status: DC

## 2017-08-18 MED ORDER — CLOPIDOGREL BISULFATE 75 MG PO TABS: 75.0000 mg | ORAL_TABLET | Freq: Every day | ORAL | 1 refills | Status: AC

## 2017-08-18 MED ORDER — INSULIN DETEMIR 100 UNIT/ML FLEXPEN: PEN_INJECTOR | SUBCUTANEOUS | 11 refills | Status: AC

## 2017-08-18 NOTE — Patient Instructions (Addendum)
Goal is around 90% 02 sats and ok with me to ventimask instead of nasal prongs   Take delsym two tsp every 12 hours and supplement if needed with  tramadol 50 mg up to 2 every 4 hours to suppress the urge to cough. Swallowing water or using ice chips/non mint and menthol containing candies (such as lifesavers or sugarless jolly ranchers) are also effective.  You should rest your voice and avoid activities that you know make you cough.  Once you have eliminated the cough for 3 straight days try reducing the tramadol first,  then the delsym as tolerated.   Prefer a different blood pressure medication from norvasc (amlodipine) which in theory at least makes it harder for the lungs to provide adequate 02 delivery  Use the flutter valve as much as possible   Protonix 40 mg Take 30- 60 min before your first and last meals of the day  GERD (REFLUX)  is an extremely common cause of respiratory symptoms just like yours , many times with no obvious heartburn at all.    It can be treated with medication, but also with lifestyle changes including elevation of the head of your bed (ideally with 6 inch  bed blocks),  Smoking cessation, avoidance of late meals, excessive alcohol, and avoid fatty foods, chocolate, peppermint, colas, red wine, and acidic juices such as orange juice.  NO MINT OR MENTHOL PRODUCTS SO NO COUGH DROPS   USE SUGARLESS CANDY INSTEAD (Jolley ranchers or Stover's or Life Savers) or even ice chips will also do - the key is to swallow to prevent all throat clearing. NO OIL BASED VITAMINS - use powdered substitutes.     Please schedule a follow up office visit in 4 weeks, sooner if needed  Add:  Consider return to [name] vs hospice approach discuss at next ov vs [name] eval depending on pt's wishes.

## 2017-08-18 NOTE — Progress Notes (Signed)
Subjective:    Patient ID: Peggy Todd, female    DOB: 1950/12/28, 66 y.o.   MRN: [id number]     Brief patient profile:   Initial eval Peggy Todd  11/05/16 This is the case of Peggy Todd, [id number] remote smoker, who made appoointment for cough. She was living and getting her care at [facility] but her daughter decided to bring her to [place] where she lives.       Patient has a 20 PY smoking history, quit in her 84s. She was diagnosed with asthma, adult onset.  Triggers : pollen. She was on singulair but was discontinued. Uses proair daily. Asthma has been stable.    Patent had a provoked DVT in her LLL in 2013 for which she was on blood thinner for a year.   She was admitted in January 2017 for bronchitis/PNA episode. >> discharged with 3L O2 24/7.  She ended up going to [facility] in 02/2016.  She was seen by Dr. Wynn Maudlin. She had bronchoscopy with biopsy in August 2017.  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 Reviewing records from [facility], patient's initial presentation was 10/2015 for walking PNA. She had a surgical lung biopsy in March 2017 which showed nonspecific findings of honeycombing, organizing pneumonia, rare granulomas, and a lymphoplasmacytic infiltrate.  She was treated with prednisone, initially from 60 mg/d, cutting down to 20 mg/d, eventually MMF 500 mg BID was added.  Her O2 requirement increased as well as her SOB.   FVC was 49%, DLCO was 41%.  CT scan was nonspecific with patchy ground glass. ESR was 23, CRP 1.1. Serologies were negative. There was concern for microaspiration causing her pulmonary issues to get worse. Swallow study was unremarkable.  Last ov 06/20/16 Dr Randol Kern rec Use oxygen when you walk. You need up to 8 LPM with exertion. I will send a new prescription to [place]. Increase cellcept to 1500 mg twice daily (3 tablets twice). Continue prednisone. I have added an antibiotic to prevent some - but not all - infections (bactrim)    F/u 3 mon > not done    at ov with Peggy Todd reported  She was given Cellcept 1500 BID which she ended up taking for a year with no significant change per patient. She was also given prednisone (as high as 40 mg for several weeks) with no significant change. She has since been on Prednisone 10 mg daily rec Continue using your  oxygen as you have been. Continue your  current medicines for your reflux and cough. Echo > diastolic dysfunction gr I , no PH  11/18/16  pfts rec/ not done        At [facility] could walk 8lpm room to room at Nov 3rd      06/27/2017  f/u ov/Peggy Todd re:  resp failure of ? Etiology/ multiple centers of care/ confused with instructions per Peggy Todd note Chief Complaint  Patient presents with  . Follow-up    Former patient of Dr Peggy Todd. She c/o increased SOB and prod cough with brown sputum.    Some better in April 2018 p stents on / never <prednisone 10 since started and back on cell cept x one month> no better protonix no timing with meals At end of admit 06/21/17 in [place]  no better p admit >  Swallowing better since EGD/ dilation 06/19/17 (but study was nl) and on protonix but not timed ac  On high dose norvasc with  desats on "10 liters" x across the room  Notes brown mucus "maybe once a week"  rec Adjust 02 to keep it > 88% as the goal  Reduce the amlodipine 10 mg to one half daily (high doses may contribute to low 02) Protonix 40 mg Take 30- 60 min before your first and last meals of the day  GERD Take delsym two tsp every 12 hours and supplement if needed with  tramadol 50 mg up to 1- 2 every 4 hours to suppress the urge to cough.  Once you have eliminated the cough for 3 straight days try reducing the tramadol first,  then the delsym as tolerated.   Please remember to go to the lab and x-ray department downstairs in the basement  for your tests - we will call you with the results when they are available. Please see patient coordinator before you  leave today  to schedule humidity for your 02 and an Internal medicine evaluation See Peggy Todd w/in 2 weeks   Once you have seen Peggy and we are sure that we're all on the same page with your medication use she will arrange follow up with Dr Chase Caller   Late add: did not go to xray or lab as rec > did not return to Peggy Todd for med rec     08/18/2017  f/u ov/Peggy Todd re:  PF /02 dep / pred at 10 mg per day  Chief Complaint  Patient presents with  . Acute Visit    Pt having alot of nasal congestion and cough since the last visit. Her sats are dropping b/c she is unable to breathe through her nose. She is coughing up some yellow/brown to clear sputum.    Doe x across the room / lots of cough daytime cough  > noct min productive in am only   No obvious day to day or daytime variability or assoc   mucus plugs or hemoptysis or cp or chest tightness, subjective wheeze or overt sinus or hb symptoms. No unusual exp hx or h/o childhood pna/ asthma or knowledge of premature birth.  Sleeping ok flat without nocturnal    exacerbation  of respiratory  c/o's or need for noct saba. Also denies any obvious fluctuation of symptoms with weather or environmental changes or other aggravating or alleviating factors except as outlined above   Current Allergies, Complete Past Medical History, Past Surgical History, Family History, and Social History were reviewed in [employer] record.  ROS  The following are not active complaints unless bolded Hoarseness, sore throat, dysphagia, dental problems, itching, sneezing,  nasal congestion or discharge of excess mucus or purulent secretions, ear ache,   fever, chills, sweats, unintended wt loss or wt gain, classically pleuritic or exertional cp,  orthopnea pnd or leg swelling, presyncope, palpitations, abdominal pain, anorexia, nausea, vomiting, diarrhea  or change in bowel habits or change in bladder habits, change in stools or change in urine, dysuria,  hematuria,  rash, arthralgias, visual complaints, headache, numbness, weakness or ataxia or problems with walking or coordination,  change in mood/affect or memory.        Current Meds  Medication Sig  . albuterol (PROAIR HFA) 108 (90 Base) MCG/ACT inhaler Inhale 2 puffs into the lungs 4 (four) times daily as needed.  Marland Kitchen amLODipine (NORVASC) 10 MG tablet One half daily (Patient taking differently: Take 10 mg by mouth daily. One half daily)  . clopidogrel (PLAVIX) 75 MG tablet Take 1 tablet (75 mg  total) daily by mouth.  . famotidine (PEPCID) 20 MG tablet Take 1 tablet (20 mg total) by mouth at bedtime.  . furosemide (LASIX) 40 MG tablet Take 40 mg by mouth daily.  . insulin aspart (NOVOLOG) 100 UNIT/ML injection Inject 4 Units into the skin 3 (three) times daily with meals.  . Insulin Detemir (LEVEMIR) 100 UNIT/ML Pen Inject 15 units in AM and 25 units in PM  . magnesium citrate SOLN Take 10 mLs by mouth daily as needed for severe constipation.  . mycophenolate (CELLCEPT) 500 MG tablet Take 500 mg by mouth 2 (two) times daily.  . nitroGLYCERIN (NITROSTAT) 0.4 MG SL tablet Place 0.4 mg under the tongue every 5 (five) minutes as needed for chest pain.  . OXYGEN 6lpm with rest and 8-10 lpm with exertion  . pantoprazole (PROTONIX) 40 MG tablet Take 30- 60 min before your first and last meals of the day  . polyethylene glycol (MIRALAX / GLYCOLAX) packet Take 17 g by mouth daily.  . predniSONE (DELTASONE) 10 MG tablet Take 1 tablet (10 mg total) by mouth daily with breakfast.  . pregabalin (LYRICA) 100 MG capsule Take 1 capsule (100 mg total) 2 (two) times daily by mouth.  . rosuvastatin (CRESTOR) 20 MG tablet Take 20 mg by mouth daily.  . sertraline (ZOLOFT) 25 MG tablet Take 1 tablet (25 mg total) daily by mouth.  . simvastatin (ZOCOR) 40 MG tablet Take by mouth.  . traMADol (ULTRAM) 50 MG tablet 1 every 6  hours as needed for cough or pain  .   ipratropium-albuterol (DUONEB) 0.5-2.5 (3) MG/3ML  SOLN Take 3 mLs by nebulization 2 (two) times daily.  .   pantoprazole (PROTONIX) 40 MG tablet Take by mouth.                Objective:   Physical Exam   W/c bound hoarse wf nad    08/18/2017     149   06/27/17 158 lb (71.7 kg)  11/05/16 171 lb 12.8 oz (77.9 kg)    Vital signs reviewed  - - Note on arrival 02 sats  91% on 6lpm     HEENT: nl dentition, turbinates bilaterally, and oropharynx. Nl external ear canals without cough reflex   NECK :  without JVD/Nodes/TM/ nl carotid upstrokes bilaterally   LUNGS: no acc muscle use,  Nl contour chest with diffuse insp crackles/ no cough on insp    CV:  RRR  no s3 or murmur or increase in P2, and no edema   ABD:  soft and nontender with nl inspiratory excursion in the supine position. No bruits or organomegaly appreciated, bowel sounds nl  MS:    ext warm without deformities, calf tenderness, cyanosis or clubbing No obvious joint restrictions   SKIN: warm and dry without lesions    NEURO:  alert, approp, nl sensorium with  no motor or cerebellar deficits apparent.       I personally reviewed images and agree with radiology impression as follows:  CXR:   06/30/17 Congestive heart failure with bilateral pulmonary interstitial edema and small pleural effusions.      Assessment & Plan:

## 2017-08-18 NOTE — Progress Notes (Signed)
Subjective:    Patient ID: Peggy Todd, female    DOB: 11/01/1950, 66 y.o.   MRN: [id number]  HPI This is a 66 yo female, accompanied by her daughter and friend, who presents today for 1 month follow up of diabetes, ILD.  She was previously in a skilled nursing facility and was discharged on August 02, 2017. She is having home PT, OT. Getting strength back, feeling a little better.  Sessions are often cut short when her oxygen saturations drop.  ILD- Increased cough and sputum production when taking Mucinex at night. Terrible cough, taking Delsym and Tramadol which seemed to help with cough at first, now worse. Sputum is yellow/brown/thick.  She has an appointment with Dr. Sherene SiresWert (pulmonary) later today she has been having a great deal of nasal congestion and feels like her oxygen does not.  Go in her nares.  Her O2 sats will increase if she puts her nasal cannula in her mouth.  Diabetes-recently clarified her Levemir dose.  She was taking 5 units at bedtime instead of 25.  She has recently increased to 25 units in the evening with improvement of morning blood sugars.  She has been using NovoLog 7 units before meals.  Her blood sugars in the mornings are in the upper 90s-100s, middle of the day and evening 200-400s.  Last hemoglobin A1c 07/09/2017 was 9.3.  She is currently on prednisone 10 mg daily.    Past Medical History:  Diagnosis Date  . Asthma   . Depression   . Diabetic peripheral angiopathy (HCC)   . Dvt femoral (deep venous thrombosis) (HCC) ?2015   LLE  . GERD (gastroesophageal reflux disease)   . High cholesterol   . History of stomach ulcers   . Hypertension   . On home oxygen therapy "   "4-5L; high w/activity" 07/09/2017)  . Pneumonia 2017  . Pulmonary embolism (HCC) ?2015  . Type II diabetes mellitus (HCC)    Past Surgical History:  Procedure Laterality Date  . APPENDECTOMY    . BACK SURGERY    . CORONARY ANGIOPLASTY WITH STENT PLACEMENT  12/2016   "4 stents"  .  ESOPHAGOGASTRODUODENOSCOPY (EGD) WITH ESOPHAGEAL DILATION  X 2  . LAPAROSCOPIC CHOLECYSTECTOMY    . LUMBAR DISC SURGERY    . TOTAL ABDOMINAL HYSTERECTOMY    . TUBAL LIGATION     Family History  Problem Relation Age of Onset  . Heart disease Mother   . Stroke Mother   . Heart disease Father    Social History   Tobacco Use  . Smoking status: Former Smoker    Years: 10.00    Types: Cigarettes    Last attempt to quit: 1990    Years since quitting: 28.9  . Smokeless tobacco: Never Used  Substance Use Topics  . Alcohol use: No  . Drug use: No      Review of Systems  Constitutional: Positive for fatigue. Negative for fever.  HENT: Positive for congestion and postnasal drip. Negative for sore throat.   Respiratory: Positive for cough and shortness of breath.   Cardiovascular: Negative for chest pain and leg swelling.  Neurological: Negative for headaches.       Objective:   Physical Exam  Constitutional: She is oriented to person, place, and time. No distress.  Frail-appearing female in wheelchair.  Her color is better than when I saw her last month.  HENT:  Head: Normocephalic and atraumatic.  Eyes: Conjunctivae are normal.  Cardiovascular: Normal rate, regular  rhythm and normal heart sounds.  Pulmonary/Chest: Effort normal. No respiratory distress. She has wheezes (few, faint, expiratory).  Musculoskeletal: She exhibits no edema.  Neurological: She is alert and oriented to person, place, and time.  Skin: Skin is warm and dry. She is not diaphoretic.  Psychiatric: She has a normal mood and affect. Her behavior is normal. Judgment and thought content normal.  Vitals reviewed.    BP 118/62 (BP Location: Right Arm, Patient Position: Sitting, Cuff Size: Normal)   Pulse (!) 109   Temp 98.9 F (37.2 C) (Oral)   Wt 151 lb (68.5 kg)   SpO2 (!) 83%   BMI 29.49 kg/m  Wt Readings from Last 3 Encounters:  08/18/17 151 lb (68.5 kg)  07/14/17 153 lb 8 oz (69.6 kg)    07/11/17 150 lb (68 kg)  Repeat pulse ox- up to 93% with nasal cannula in mouth      Assessment & Plan:  1. Type 2 diabetes mellitus with diabetic neuropathy, with long-term current use of insulin (HCC) - will increase Levemir to BID with 15 units in morning and 25 in evening, Novolog 7 units if blood sugar greater than 120 before meals -Her daughter will continue to send me blood sugar readings via my chart  2. ILD (interstitial lung disease) (HCC) -The patient also has several questions regarding her pulmonary medications and status, we will discuss these with Dr. Sherene SiresWert at their appointment later today -Suspect this more of her low sats are from her nasal congestion, encouraged her to use saline nasal spray multiple times a day  3. Chronic anticoagulation -Continue on clopidogrel, no signs of active bleeding  -Follow-up in 2 months   Olean Reeeborah Delfina Schreurs, FNP-BC  [facility], [facility]  08/18/2017 5:47 PM

## 2017-08-18 NOTE — Patient Instructions (Signed)
New insulin instructions-  Use Levemir 15 units in the morning and 25 units in the evening  If blood sugar is over 120, use Novolog 7 units  If Dr. Sherene SiresWert makes a change in your prednisone dose it will affect your blood sugar, please let me know if readings consistently high or low  Please use nasal saline spray 3-5 times a day to help with nasal congestion  Use oxygen through mouth as needed if oxygen sats drop low (below 90)  Follow up in 2 months, keep me posted about blood sugar readings  Have a wonderful holiday

## 2017-08-20 ENCOUNTER — Encounter: Payer: Self-pay | Admitting: Internal Medicine

## 2017-08-20 NOTE — Assessment & Plan Note (Signed)
Still concerned about use of amlopdipine esp in high doses in pt with severe lung dz/ hypoxemia and this med like procardia can blunt the normal response of the pulmonary circulation which redistributes blood flow to the best oxygenated areas.  Will defer specfic choice to primary care but verapamil or arb or betablocker would be ok with me in that order in this setting

## 2017-08-20 NOTE — Assessment & Plan Note (Addendum)
Surgical lung biopsy in March 2017 which showed nonspecific findings of honeycombing, organizing pneumonia, rare granulomas, and a lymphoplasmacytic infiltrate with ddx HP, underlying connective tissue dz or aspiratoin (neg swallow eval/ neg serologies noted) - last dumc 06/20/16 by [place] rec cellcept/ pred > pt states never responded as of 06/27/2017  - as of 08/18/2017 on pred 10 mg daily and cellcept 500 mg bid > not improved  The goal with a questionably chronic steroid dependent illness is always arriving at the lowest effective dose that controls the disease/symptoms and not accepting a set "formula" which is based on statistics or guidelines that don't always take into account patient  variability or the natural hx of the dz in every individual patient, which may well vary over time.  For now therefore I recommend the patient maintain  10 mg per day / consider refer back to [name] vs hospice care soon as nothing else to offer in this clinic.  In meantime, Use of PPI is associated with improved survival time and with decreased radiologic fibrosis per King's study published in [facility] vol 184 p1390.  Dec 2011 and also may have other beneficial effects as per the latest review in [facility] vol 193 p1345 Jun 20016.  So rec max gerd rx / diet  Reviewed  I had an extended discussion with the patient reviewing all relevant studies completed to date and  lasting 15 to 20 minutes of a 25 minute visit    Each maintenance medication was reviewed in detail including most importantly the difference between maintenance and prns and under what circumstances the prns are to be triggered using an action plan format that is not reflected in the computer generated alphabetically organized AVS.    Please see AVS for specific instructions unique to this visit that I personally wrote and verbalized to the the pt in detail and then reviewed with pt  by my nurse highlighting any  changes in therapy recommended  at today's visit to their plan of care.     .Marland Kitchen

## 2017-08-20 NOTE — Assessment & Plan Note (Signed)
Requiring 6lpm at baseline but having nasal irritation despite humidfied 02 so needs ventimask titrated to sats > 90% per DME company/ ordered

## 2017-08-20 NOTE — Assessment & Plan Note (Signed)
Max rx for gerd/ tramadol for cyclical coughing 06/27/2017 >>> - added flutter valve 08/18/2017   / instructed on use

## 2017-08-26 ENCOUNTER — Encounter: Payer: Self-pay | Admitting: Family Medicine

## 2017-09-03 ENCOUNTER — Telehealth: Payer: Self-pay

## 2017-09-03 NOTE — Telephone Encounter (Signed)
Called and left voicemail for Peggy Todd with [facility] to approve twice a week physical therapy for 4 more weeks.

## 2017-09-03 NOTE — Telephone Encounter (Signed)
Copied from CRM [id number]. Topic: Inquiry >> Sep 03, 2017  1:27 PM Crist InfanteHarrald, Kathy J wrote: Reason for CRM: Enrique [facility]ackKendra [facility] would like verbal to extend home health PT 2 x a wk for 4 wks.

## 2017-09-04 ENCOUNTER — Telehealth: Payer: Self-pay

## 2017-09-04 NOTE — Telephone Encounter (Signed)
Copied from CRM [id number]. Topic: General - Other >> Sep 04, 2017  9:36 AM Percival SpanishKennedy, Cheryl W wrote:   Providence CrosbyFYI Latonya [facility] [phone] call to say pt cancel her appt she had scheduled with them on 09/03/17

## 2017-09-05 ENCOUNTER — Other Ambulatory Visit: Payer: Self-pay | Admitting: Internal Medicine

## 2017-09-05 NOTE — Telephone Encounter (Signed)
Noted  

## 2017-09-07 ENCOUNTER — Emergency Department (HOSPITAL_COMMUNITY)
Admission: EM | Admit: 2017-09-07 | Discharge: 2017-09-08 | Disposition: A | Discharge status: Home / Self Care | Payer: Medicare Other | Attending: Emergency Medicine | Admitting: Emergency Medicine

## 2017-09-07 ENCOUNTER — Encounter (HOSPITAL_COMMUNITY): Payer: Self-pay | Admitting: Emergency Medicine

## 2017-09-07 ENCOUNTER — Other Ambulatory Visit: Payer: Self-pay

## 2017-09-07 DIAGNOSIS — I2510 Atherosclerotic heart disease of native coronary artery without angina pectoris: Secondary | ICD-10-CM | POA: Insufficient documentation

## 2017-09-07 DIAGNOSIS — I10 Essential (primary) hypertension: Secondary | ICD-10-CM | POA: Insufficient documentation

## 2017-09-07 DIAGNOSIS — E119 Type 2 diabetes mellitus without complications: Secondary | ICD-10-CM | POA: Diagnosis not present

## 2017-09-07 DIAGNOSIS — Z9981 Dependence on supplemental oxygen: Secondary | ICD-10-CM | POA: Diagnosis not present

## 2017-09-07 DIAGNOSIS — J45909 Unspecified asthma, uncomplicated: Secondary | ICD-10-CM | POA: Diagnosis not present

## 2017-09-07 DIAGNOSIS — R0602 Shortness of breath: Secondary | ICD-10-CM | POA: Diagnosis present

## 2017-09-07 DIAGNOSIS — E039 Hypothyroidism, unspecified: Secondary | ICD-10-CM | POA: Insufficient documentation

## 2017-09-07 DIAGNOSIS — Z87891 Personal history of nicotine dependence: Secondary | ICD-10-CM | POA: Insufficient documentation

## 2017-09-07 DIAGNOSIS — Z794 Long term (current) use of insulin: Secondary | ICD-10-CM | POA: Insufficient documentation

## 2017-09-07 DIAGNOSIS — R0902 Hypoxemia: Secondary | ICD-10-CM | POA: Diagnosis not present

## 2017-09-07 DIAGNOSIS — Z7902 Long term (current) use of antithrombotics/antiplatelets: Secondary | ICD-10-CM | POA: Insufficient documentation

## 2017-09-07 DIAGNOSIS — Z955 Presence of coronary angioplasty implant and graft: Secondary | ICD-10-CM | POA: Diagnosis not present

## 2017-09-07 NOTE — ED Provider Notes (Signed)
[facility] Note   CSN: [id number] Arrival date & time: 09/07/17  2311     History   Chief Complaint Chief Complaint  Patient presents with  . [place]- Oxygen out at home    HPI Peggy Todd is a 66 y.o. female.  HPI  This is a 66 year old female with a history of asthma, PE, hypertension, diabetes on chronic home oxygen who presents because she is out of oxygen at home.  Patient reports that her oxygen concentrator stopped working this morning.  She has tried all day to get this replaced.  However because of the weather, she was unable to have home health care come out and replace her oxygen.  She is oxygen dependent on 4-5 L daily.  She has no other acute complaints.  No chest pain, worsening shortness of breath, new cough or fevers.  She is just here because of her oxygen needs.  Past Medical History:  Diagnosis Date  . Asthma   . Depression   . Diabetic peripheral angiopathy (HCC)   . Dvt femoral (deep venous thrombosis) (HCC) ?2015   LLE  . GERD (gastroesophageal reflux disease)   . High cholesterol   . History of stomach ulcers   . Hypertension   . On home oxygen therapy "   "4-5L; high w/activity" 07/09/2017)  . Pneumonia 2017  . Pulmonary embolism (HCC) ?2015  . Type II diabetes mellitus [facility](HCC)     Patient Active Problem List   Diagnosis Date Noted  . Chronic respiratory failure with hypoxia and hypercapnia (HCC) 08/18/2017  . E. coli UTI (urinary tract infection) 07/10/2017  . Enterococcus UTI 07/10/2017  . Hypokalemia 07/08/2017  . Hyperglycemia   . Upper airway cough syndrome 06/28/2017  . DOE (dyspnea on exertion) 06/27/2017  . Chronic respiratory failure with hypoxia (HCC) 06/27/2017  . Degeneration of lumbar intervertebral disc 05/06/2017  . CAD (coronary artery disease) 12/12/2016  . ILD (interstitial lung disease) (HCC) 11/06/2016  . Hypoxemia 11/06/2016  . Idiopathic interstitial pneumonia (HCC)  06/18/2016  . Acute respiratory failure with hypoxia (HCC) 12/26/2015  . Opioid overdose (HCC) 12/26/2015  . Acute hypoxemic respiratory failure (HCC) 10/20/2015  . Anxiety and depression 10/20/2015  . Gastroesophageal reflux disease without esophagitis 10/20/2015  . Iron deficiency anemia 10/20/2015  . Pulmonary edema 10/20/2015  . Herniated nucleus pulposus, L4-5 left 08/08/2015  . Chronic anticoagulation 07/26/2015  . Dyslipidemia 07/26/2015  . Leg DVT (deep venous thromboembolism), chronic, left (HCC) 03/03/2013  . History of pulmonary embolism 02/28/2013  . Spinal stenosis of lumbar region without neurogenic claudication 08/25/2012  . Acquired spondylolisthesis 07/22/2012  . Idiopathic peripheral neuropathy 07/09/2012  . Essential hypertension 06/06/2011  . Hypothyroidism 06/06/2011  . Type 2 diabetes mellitus with neurologic complication, with long-term current use of insulin (HCC) 06/06/2011    Past Surgical History:  Procedure Laterality Date  . APPENDECTOMY    . BACK SURGERY    . CORONARY ANGIOPLASTY WITH STENT PLACEMENT  12/2016   "4 stents"  . ESOPHAGOGASTRODUODENOSCOPY (EGD) WITH ESOPHAGEAL DILATION  X 2  . LAPAROSCOPIC CHOLECYSTECTOMY    . LUMBAR DISC SURGERY    . TOTAL ABDOMINAL HYSTERECTOMY    . TUBAL LIGATION      OB History    No data available       Home Medications    Prior to Admission medications   Medication Sig Start Date End Date Taking? Authorizing Provider  albuterol (PROAIR HFA) 108 (90 Base) MCG/ACT inhaler Inhale  2 puffs into the lungs 4 (four) times daily as needed. 10/30/13   [provider]  amLODipine (NORVASC) 10 MG tablet One half daily Patient taking differently: Take 10 mg by mouth daily. One half daily 06/27/17   Nyoka Cowden, MD  clopidogrel (PLAVIX) 75 MG tablet Take 1 tablet (75 mg total) daily by mouth. 08/18/17   Emi Belfast, FNP  famotidine (PEPCID) 20 MG tablet Take 1 tablet (20 mg total) by mouth at  bedtime. 07/12/17   Marguerita Merles Latif, DO  furosemide (LASIX) 40 MG tablet Take 40 mg by mouth daily.    [provider]  insulin aspart (NOVOLOG) 100 UNIT/ML injection Inject 4 Units into the skin 3 (three) times daily with meals. 07/12/17   Marguerita Merles Latif, DO  Insulin Detemir (LEVEMIR) 100 UNIT/ML Pen Inject 15 units in AM and 25 units in PM 08/18/17   Emi Belfast, FNP  magnesium citrate SOLN Take 10 mLs by mouth daily as needed for severe constipation.    [provider]  mycophenolate (CELLCEPT) 500 MG tablet Take 500 mg by mouth 2 (two) times daily.    [provider]  nitroGLYCERIN (NITROSTAT) 0.4 MG SL tablet Place 0.4 mg under the tongue every 5 (five) minutes as needed for chest pain.    [provider]  OXYGEN 6lpm with rest and 8-10 lpm with exertion    [provider]  pantoprazole (PROTONIX) 40 MG tablet Take 30- 60 min before your first and last meals of the day 08/18/17   Nyoka Cowden, MD  polyethylene glycol (MIRALAX / GLYCOLAX) packet Take 17 g by mouth daily.    [provider]  predniSONE (DELTASONE) 10 MG tablet Take 1 tablet (10 mg total) by mouth daily with breakfast. 07/13/17   Marguerita Merles Latif, DO  pregabalin (LYRICA) 100 MG capsule Take 1 capsule (100 mg total) 2 (two) times daily by mouth. 08/18/17   Emi Belfast, FNP  Respiratory Therapy Supplies (FLUTTER) DEVI Use as directed 08/18/17   Nyoka Cowden, MD  rosuvastatin (CRESTOR) 20 MG tablet Take 20 mg by mouth daily.    [provider]  sertraline (ZOLOFT) 25 MG tablet Take 1 tablet (25 mg total) daily by mouth. 08/18/17   Emi Belfast, FNP  simvastatin (ZOCOR) 40 MG tablet Take by mouth. 03/01/16   [provider]  traMADol (ULTRAM) 50 MG tablet TAKE 1 TABLET BY MOUTH EVERY 6 HOURS AS NEEDED FOR COUGH OR PAIN 09/05/17   Nyoka Cowden, MD    Family History Family History  Problem Relation Age of Onset  . Heart  disease Mother   . Stroke Mother   . Heart disease Father     Social History Social History   Tobacco Use  . Smoking status: Former Smoker    Years: 10.00    Types: Cigarettes    Last attempt to quit: 1990    Years since quitting: 28.9  . Smokeless tobacco: Never Used  Substance Use Topics  . Alcohol use: No  . Drug use: No     Allergies   Oxycodone; Penicillins; Codeine; Hydrochlorothiazide; and Atorvastatin   Review of Systems Review of Systems  Constitutional: Negative for fever.  Respiratory: Positive for shortness of breath. Negative for cough.   Cardiovascular: Negative for chest pain.  Gastrointestinal: Negative for abdominal pain.  All other systems reviewed and are negative.    Physical Exam Updated Vital Signs BP 114/77 (BP Location: Right  Arm)   Pulse (!) 110   Temp 98 F (36.7 C) (Oral)   Resp 20   Ht 5' (1.524 m)   Wt 67.6 kg (149 lb)   SpO2 94%   BMI 29.10 kg/m   Physical Exam  Constitutional: She is oriented to person, place, and time.  Elderly, chronically ill-appearing, no acute distress  HENT:  Head: Normocephalic and atraumatic.  Cardiovascular: Normal rate, regular rhythm and normal heart sounds.  Pulmonary/Chest: Effort normal. No respiratory distress. She has no wheezes.  Coarse breath sounds in all lung fields, nasal cannula in place  Abdominal: Soft. There is no tenderness.  Neurological: She is alert and oriented to person, place, and time.  Skin: Skin is warm and dry.  Psychiatric: She has a normal mood and affect.  Nursing note and vitals reviewed.    ED Treatments / Results  Labs (all labs ordered are listed, but only abnormal results are displayed) Labs Reviewed - No data to display  EKG  EKG Interpretation None       Radiology No results found.  Procedures Procedures (including critical care time)  Medications Ordered in ED Medications - No data to display   Initial Impression / Assessment and Plan /  ED Course  I have reviewed the triage vital signs and the nursing notes.  Pertinent labs & imaging results that were available during my care of the patient were reviewed by me and considered in my medical decision making (see chart for details).     She presents because she is out of her home oxygen.  She has no acute complaints.  Chronic shortness of breath.  She is generally nontoxic appearing.  No respiratory distress.  We will try to assist in helping her obtain her home oxygen.  She will likely need to board in the ED overnight.  Will minimize workup.   Final Clinical Impressions(s) / ED Diagnoses   Final diagnoses:  Oxygen supply absent    ED Discharge Orders    None       Taralee Marcus, Mayer Maskerourtney F, MD 09/07/17 2350

## 2017-09-07 NOTE — ED Triage Notes (Signed)
Pt to ED via [facility].  States her oxygen concentrator stopped working this morning and her daughter has been on the phone since this morning trying to get someone from the company to come change it out and they won't come due to snow.  Pt denies any complaints at this time.  On 6L [place] at home.  Pt talking on cell phone to family members.  NAD.

## 2017-09-08 MED ORDER — INSULIN ASPART 100 UNIT/ML ~~LOC~~ SOLN: 4.0000 [IU] | Freq: Three times a day (TID) | SUBCUTANEOUS | Status: DC

## 2017-09-08 MED ORDER — INSULIN DETEMIR 100 UNIT/ML ~~LOC~~ SOLN: 25.0000 [IU] | Freq: Every day | SUBCUTANEOUS | Status: DC

## 2017-09-08 MED ORDER — INSULIN DETEMIR 100 UNIT/ML FLEXPEN
15.0000 [IU] | PEN_INJECTOR | Freq: Every morning | SUBCUTANEOUS | Status: DC
Start: 2017-09-08 — End: 2017-09-08

## 2017-09-08 NOTE — ED Notes (Signed)
Gave pt water, per Asher MuirJamie - RN.

## 2017-09-08 NOTE — ED Notes (Signed)
Pt's family member (daughter) arrived and wants to take pt home. Informed Jamie - RN.

## 2017-09-08 NOTE — ED Notes (Signed)
Will give insulin when meal tray arrives.

## 2017-09-08 NOTE — Discharge Planning (Signed)
EDCM spoke with pt at bedside who states she came to ER after no communication from [employer] about replacing her concentrator.  EDCM contacted Trudie ReedJames, [employer] who states he will get concentrator and back up tanks to pt home within 3 hours.

## 2017-09-10 ENCOUNTER — Encounter: Payer: Self-pay | Admitting: Internal Medicine

## 2017-09-10 ENCOUNTER — Telehealth: Payer: Self-pay | Admitting: Internal Medicine

## 2017-09-10 MED ORDER — MYCOPHENOLATE MOFETIL 500 MG PO TABS: 500.0000 mg | ORAL_TABLET | Freq: Two times a day (BID) | ORAL | 1 refills | Status: DC

## 2017-09-10 MED ORDER — PREGABALIN 100 MG PO CAPS: 100.0000 mg | ORAL_CAPSULE | Freq: Two times a day (BID) | ORAL | 1 refills | Status: DC

## 2017-09-10 NOTE — Telephone Encounter (Signed)
Rec'd completed forms - fwd to [employer] via interoffice mail -pr  °

## 2017-09-10 NOTE — Telephone Encounter (Signed)
Forms signed and given back to patrice.

## 2017-09-11 ENCOUNTER — Other Ambulatory Visit: Payer: Self-pay

## 2017-09-24 ENCOUNTER — Other Ambulatory Visit: Payer: Self-pay | Admitting: Internal Medicine

## 2017-09-25 MED ORDER — TRAMADOL HCL 50 MG PO TABS: ORAL_TABLET | ORAL | 0 refills | Status: DC

## 2017-09-26 ENCOUNTER — Other Ambulatory Visit: Payer: Self-pay | Admitting: Internal Medicine

## 2017-10-06 ENCOUNTER — Ambulatory Visit (INDEPENDENT_AMBULATORY_CARE_PROVIDER_SITE_OTHER)
Admission: RE | Admit: 2017-10-06 | Discharge: 2017-10-06 | Disposition: A | Discharge status: Home / Self Care | Payer: Medicare Other | Source: Ambulatory Visit | Attending: Internal Medicine | Admitting: Internal Medicine

## 2017-10-06 ENCOUNTER — Encounter: Payer: Self-pay | Admitting: Internal Medicine

## 2017-10-06 ENCOUNTER — Other Ambulatory Visit (INDEPENDENT_AMBULATORY_CARE_PROVIDER_SITE_OTHER): Payer: Medicare Other

## 2017-10-06 ENCOUNTER — Telehealth: Payer: Self-pay | Admitting: Internal Medicine

## 2017-10-06 ENCOUNTER — Ambulatory Visit (INDEPENDENT_AMBULATORY_CARE_PROVIDER_SITE_OTHER): Payer: Medicare Other | Admitting: Internal Medicine

## 2017-10-06 VITALS — BP 106/62 | HR 100 | Ht 60.0 in | Wt 145.0 lb

## 2017-10-06 DIAGNOSIS — J849 Interstitial pulmonary disease, unspecified: Secondary | ICD-10-CM | POA: Diagnosis not present

## 2017-10-06 DIAGNOSIS — R0609 Other forms of dyspnea: Secondary | ICD-10-CM

## 2017-10-06 DIAGNOSIS — J9611 Chronic respiratory failure with hypoxia: Secondary | ICD-10-CM | POA: Diagnosis not present

## 2017-10-06 DIAGNOSIS — J9612 Chronic respiratory failure with hypercapnia: Secondary | ICD-10-CM

## 2017-10-06 DIAGNOSIS — I10 Essential (primary) hypertension: Secondary | ICD-10-CM | POA: Diagnosis not present

## 2017-10-06 LAB — CBC WITH DIFFERENTIAL/PLATELET
BASOS PCT: 0.3 % (ref 0.0–3.0)
Basophils Absolute: 0.1 10*3/uL (ref 0.0–0.1)
EOS ABS: 0.3 10*3/uL (ref 0.0–0.7)
EOS PCT: 1.4 % (ref 0.0–5.0)
HCT: 42.3 % (ref 36.0–46.0)
Hemoglobin: 13.4 g/dL (ref 12.0–15.0)
LYMPHS ABS: 3.1 10*3/uL (ref 0.7–4.0)
Lymphocytes Relative: 13.4 % (ref 12.0–46.0)
MCHC: 31.5 g/dL (ref 30.0–36.0)
MCV: 90.9 fl (ref 78.0–100.0)
MONO ABS: 1.7 10*3/uL — AB (ref 0.1–1.0)
Monocytes Relative: 7.3 % (ref 3.0–12.0)
NEUTROS PCT: 77.6 % — AB (ref 43.0–77.0)
Neutro Abs: 18.1 10*3/uL — ABNORMAL HIGH (ref 1.4–7.7)
Platelets: 245 10*3/uL (ref 150.0–400.0)
RBC: 4.65 Mil/uL (ref 3.87–5.11)
RDW: 14.5 % (ref 11.5–15.5)
WBC: 23.4 10*3/uL (ref 4.0–10.5)

## 2017-10-06 LAB — BASIC METABOLIC PANEL
BUN: 21 mg/dL (ref 6–23)
CO2: 34 mEq/L — ABNORMAL HIGH (ref 19–32)
Calcium: 9.9 mg/dL (ref 8.4–10.5)
Chloride: 91 mEq/L — ABNORMAL LOW (ref 96–112)
Creatinine, Ser: 1.04 mg/dL (ref 0.40–1.20)
GFR: 56.26 mL/min — AB (ref >60.00)
Glucose, Bld: 236 mg/dL — ABNORMAL HIGH (ref 70–99)
POTASSIUM: 3.2 meq/L — AB (ref 3.5–5.1)
SODIUM: 141 meq/L (ref 135–145)

## 2017-10-06 LAB — BRAIN NATRIURETIC PEPTIDE: PRO B NATRI PEPTIDE: 207 pg/mL — AB (ref 0.0–100.0)

## 2017-10-06 LAB — SEDIMENTATION RATE: Sed Rate: 59 mm/hr — ABNORMAL HIGH (ref 0–30)

## 2017-10-06 LAB — TSH: TSH: 1.5 u[IU]/mL (ref 0.35–4.50)

## 2017-10-06 MED ORDER — TRAMADOL HCL 50 MG PO TABS: ORAL_TABLET | ORAL | 0 refills | Status: DC

## 2017-10-06 NOTE — Progress Notes (Signed)
Subjective:    Patient ID: Peggy Todd, female    DOB: 1950/12/28, 67 y.o.   MRN: [id number]     Brief patient profile:   Initial eval Peggy Todd  11/05/16 This is the case of Peggy Todd, [id number] remote smoker, who made appoointment for cough. She was living and getting her care at [facility] but her daughter decided to bring her to [place] where she lives.       Patient has a 20 PY smoking history, quit in her 84s. She was diagnosed with asthma, adult onset.  Triggers : pollen. She was on singulair but was discontinued. Uses proair daily. Asthma has been stable.    Patent had a provoked DVT in her LLL in 2013 for which she was on blood thinner for a year.   She was admitted in January 2017 for bronchitis/PNA episode. >> discharged with 3L O2 24/7.  She ended up going to [facility] in 02/2016.  She was seen by Dr. Wynn Maudlin. She had bronchoscopy with biopsy in August 2017.  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 Reviewing records from [facility], patient's initial presentation was 10/2015 for walking PNA. She had a surgical lung biopsy in March 2017 which showed nonspecific findings of honeycombing, organizing pneumonia, rare granulomas, and a lymphoplasmacytic infiltrate.  She was treated with prednisone, initially from 60 mg/d, cutting down to 20 mg/d, eventually MMF 500 mg BID was added.  Her O2 requirement increased as well as her SOB.   FVC was 49%, DLCO was 41%.  CT scan was nonspecific with patchy ground glass. ESR was 23, CRP 1.1. Serologies were negative. There was concern for microaspiration causing her pulmonary issues to get worse. Swallow study was unremarkable.  Last ov 06/20/16 Dr Randol Kern rec Use oxygen when you walk. You need up to 8 LPM with exertion. I will send a new prescription to [place]. Increase cellcept to 1500 mg twice daily (3 tablets twice). Continue prednisone. I have added an antibiotic to prevent some - but not all - infections (bactrim)    F/u 3 mon > not done    at ov with Peggy Todd reported  She was given Cellcept 1500 BID which she ended up taking for a year with no significant change per patient. She was also given prednisone (as high as 40 mg for several weeks) with no significant change. She has since been on Prednisone 10 mg daily rec Continue using your  oxygen as you have been. Continue your  current medicines for your reflux and cough. Echo > diastolic dysfunction gr I , no PH  11/18/16  pfts rec/ not done        At end of stent at [facility] could walk 8lpm room to room at Nov 3rd      06/27/2017  f/u ov/Peggy Todd re:  resp failure of ? Etiology/ multiple centers of care/ confused with instructions per Peggy Todd note Chief Complaint  Patient presents with  . Follow-up    Former patient of Dr Peggy Todd. She c/o increased SOB and prod cough with brown sputum.    Some better in April 2018 p stents on / never <prednisone 10 since started and back on cell cept x one month> no better protonix no timing with meals At end of admit 06/21/17 in [place]  no better p admit >  Swallowing better since EGD/ dilation 06/19/17 (but study was nl) and on protonix but not timed ac  On high dose norvasc with  desats on "10 liters" x across the room  Notes brown mucus "maybe once a week"  rec Adjust 02 to keep it > 88% as the goal  Reduce the amlodipine 10 mg to one half daily (high doses may contribute to low 02) Protonix 40 mg Take 30- 60 min before your first and last meals of the day  GERD Take delsym two tsp every 12 hours and supplement if needed with  tramadol 50 mg up to 1- 2 every 4 hours to suppress the urge to cough.  Once you have eliminated the cough for 3 straight days try reducing the tramadol first,  then the delsym as tolerated.   Please remember to go to the lab and x-ray department downstairs in the basement  for your tests - we will call you with the results when they are available. Please see patient coordinator before you  leave today  to schedule humidity for your 02 and an Internal medicine evaluation See [name] NP w/in 2 weeks   Once you have seen [name] and we are sure that we're all on the same page with your medication use she will arrange follow up with Dr Chase Caller   Late add: did not go to xray or lab as rec > did not return to [name] NP for med rec     08/18/2017  f/u ov/Peggy Todd re:  PF /02 dep / pred at 10 mg per day  Chief Complaint  Patient presents with  . Acute Visit    Pt having alot of nasal congestion and cough since the last visit. Her sats are dropping b/c she is unable to breathe through her nose. She is coughing up some yellow/brown to clear sputum.    Doe x across the room / lots of cough daytime cough  > noct min productive in am only  rec Goal is around 90% 02 sats and ok with me to ventimask instead of nasal prongs  Take delsym two tsp every 12 hours and supplement if needed with  tramadol 50 mg up to 2 every 4 hours  Use the flutter valve as much as possible  Protonix 40 mg Take 30- 60 min before your first and last meals of the day GERD diet   Late Add:  Consider return to Dr Randol Kern vs hospice approach discuss at next ov vs [name] eval depending on pt's wishes.     10/06/2017  f/u ov/Peggy Todd re:   PF/ 02 dep/ Pred @ ? rx   plus cell cept  Chief Complaint  Patient presents with  . Follow-up    Breathing is progressively worse and she is coughing more. Cough is usually non prod but will occ produce some white sputum.  Sats after stepping on the scale here this today were 55% 5lpm--increased to 88% 6lpm continuous.   cough is worse during the day and minimally prod mucoid sputum  Sleeping 45 degrees Confused with  meds, not sure how much pred she's taking/ only using one or two tramadol a day max to control cough  Planned to get facemask at last ov due to nasal obst but did not call when DME company failed to deliver one  No obvious day to day or daytime variability or assoc  excess/ purulent sputum or mucus plugs or hemoptysis or cp or chest tightness, subjective wheeze or overt sinus or hb symptoms. No unusual exposure hx or h/o childhood pna/ asthma or knowledge of premature birth.  Sleeping ok at 45 degrees  without nocturnal  or early am exacerbation  of respiratory  c/o's or need for noct saba. Also denies any obvious fluctuation of symptoms with weather or environmental changes or other aggravating or alleviating factors except as outlined above   Current Allergies, Complete Past Medical History, Past Surgical History, Family History, and Social History were reviewed in [employer] record.  ROS  The following are not active complaints unless bolded Hoarseness, sore throat, dysphagia, dental problems, itching, sneezing,  nasal congestion or discharge of excess mucus or purulent secretions, ear ache,   fever, chills, sweats, unintended wt loss or wt gain, classically pleuritic or exertional cp,  orthopnea pnd or leg swelling, presyncope, palpitations, abdominal pain, anorexia, nausea, vomiting, diarrhea  or change in bowel habits or change in bladder habits, change in stools or change in urine, dysuria, hematuria,  rash, arthralgias, visual complaints, headache, numbness, weakness or ataxia or problems with walking or coordination,  change in mood/affect or memory.        Current Meds  Medication Sig  . albuterol (PROAIR HFA) 108 (90 Base) MCG/ACT inhaler Inhale 2 puffs into the lungs 4 (four) times daily as needed.  . clopidogrel (PLAVIX) 75 MG tablet Take 1 tablet (75 mg total) daily by mouth.  . furosemide (LASIX) 20 MG tablet Take 30 mg by mouth.  . insulin aspart (NOVOLOG) 100 UNIT/ML injection Inject 4 Units into the skin 3 (three) times daily with meals.  . Insulin Detemir (LEVEMIR) 100 UNIT/ML Pen Inject 15 units in AM and 25 units in PM  . magnesium citrate SOLN Take 10 mLs by mouth daily as needed for severe constipation.  .  mycophenolate (CELLCEPT) 500 MG tablet Take 1 tablet (500 mg total) by mouth 2 (two) times daily.  . nitroGLYCERIN (NITROSTAT) 0.4 MG SL tablet Place 0.4 mg under the tongue every 5 (five) minutes as needed for chest pain.  . OXYGEN 6lpm with rest and 8-10 lpm with exertion  . pantoprazole (PROTONIX) 40 MG tablet Take 30- 60 min before your first and last meals of the day  . polyethylene glycol (MIRALAX / GLYCOLAX) packet Take 17 g by mouth daily.  . predniSONE (DELTASONE) 10 MG tablet Take 1 tablet (10 mg total) by mouth daily with breakfast.  . pregabalin (LYRICA) 100 MG capsule Take 1 capsule (100 mg total) by mouth 2 (two) times daily.  Marland [employer] Respiratory Therapy Supplies (FLUTTER) DEVI Use as directed  . rosuvastatin (CRESTOR) 20 MG tablet Take 20 mg by mouth daily.  . simvastatin (ZOCOR) 40 MG tablet Take by mouth.  . traMADol (ULTRAM) 50 MG tablet TAKE 1 TABLET BY MOUTH EVERY 6 HOURS AS NEEDED FOR COUGH OR PAIN  . [  amLODipine (NORVASC) 10 MG tablet One half daily (Patient taking differently: Take 10 mg by mouth daily. One half daily)  . [DISCONTINUED] traMADol (ULTRAM) 50 MG tablet TAKE 1 TABLET BY MOUTH EVERY 6 HOURS AS NEEDED FOR COUGH OR PAIN                     Objective:   Physical Exam  W/c bound    10/06/2017            08/18/2017     149   06/27/17 158 lb (71.7 kg)  11/05/16 171 lb 12.8 oz (77.9 kg)    Vital signs reviewed - Note on arrival 02 sats  88% on 6lpm     HEENT: nl dentition, turbinates bilaterally, and oropharynx. Nl  external ear canals without cough reflex   NECK :  without JVD/Nodes/TM/ nl carotid upstrokes bilaterally   LUNGS: no acc muscle use,  Nl contour chest with diffuse insp crackles/ no cough on insp   CV:  RRR  no s3 or murmur or increase in P2, and trace bilateral pitting pedal edema  ABD:  soft and nontender with nl inspiratory excursion in the supine position. No bruits or organomegaly appreciated, bowel sounds nl  MS:    ext warm  without deformities, calf tenderness, cyanosis or clubbing No obvious joint restrictions   SKIN: warm and dry without lesions    NEURO:  alert, approp, nl sensorium with  no motor or cerebellar deficits apparent.      CXR PA and Lateral:   10/06/2017 :    I personally reviewed images and agree with radiology impression as follows:    Severe diffuse bilateral interstitial opacities, likely interstitial Edema/CHF   Labs ordered/ reviewed:      Chemistry      Component Value Date/Time   NA 141 10/06/2017 0935   K 3.2 (L) 10/06/2017 0935   CL 91 (L) 10/06/2017 0935   CO2 34 (H) 10/06/2017 0935   BUN 21 10/06/2017 0935   CREATININE 1.04 10/06/2017 0935   CREATININE 1.18 (H) 11/05/2016 1628      Component Value Date/Time   CALCIUM 9.9 10/06/2017 0935   ALKPHOS 156 (H) 07/14/2017 1114   AST 40 (H) 07/14/2017 1114   ALT 32 07/14/2017 1114   BILITOT 0.5 07/14/2017 1114        Lab Results  Component Value Date   WBC 23.4 Repeated and verified X2. ([place]) 10/06/2017   HGB 13.4 10/06/2017   HCT 42.3 10/06/2017   MCV 90.9 10/06/2017   PLT 245.0 10/06/2017         Lab Results  Component Value Date   TSH 1.50 10/06/2017     Lab Results  Component Value Date   PROBNP 207.0 (H) 10/06/2017       Lab Results  Component Value Date   ESRSEDRATE 59 (H) 10/06/2017                  Assessment & Plan:

## 2017-10-06 NOTE — Telephone Encounter (Signed)
Peggy Todd is aware of below message and voiced her understanding. Nothing further is needed

## 2017-10-06 NOTE — Patient Instructions (Addendum)
Goal is around 90% 02 sats and ok with me to ventimask instead of nasal prongs   Take delsym two tsp every 12 hours and supplement if needed with  tramadol 50 mg up to 2 every 4 hours to suppress the urge to cough. Swallowing water or using ice chips/non mint and menthol containing candies (such as lifesavers or sugarless jolly ranchers) are also effective.  You should rest your voice and avoid activities that you know make you cough.  Once you have eliminated the cough for 3 straight days try reducing the tramadol first,  then the delsym as tolerated.   Prefer a different blood pressure medication from norvasc (amlodipine) which in theory at least makes it harder for the lungs to provide adequate 02 delivery  Use the flutter valve as much as possible   Protonix 40 mg Take 30- 60 min before your first and last meals of the day  GERD (REFLUX)  is an extremely common cause of respiratory symptoms just like yours , many times with no obvious heartburn at all.    It can be treated with medication, but also with lifestyle changes including elevation of the head of your bed (ideally with 6 inch  bed blocks),  Smoking cessation, avoidance of late meals, excessive alcohol, and avoid fatty foods, chocolate, peppermint, colas, red wine, and acidic juices such as orange juice.  NO MINT OR MENTHOL PRODUCTS SO NO COUGH DROPS   USE SUGARLESS CANDY INSTEAD (Jolley ranchers or Stover's or Life Savers) or even ice chips will also do - the key is to swallow to prevent all throat clearing. NO OIL BASED VITAMINS - use powdered substitutes.     Stop amlodipine   See [name] NP w/in 2 weeks (or first available) with all your medications, even over the counter meds, separated in two separate bags, the ones you take no matter what vs the ones you stop once you feel better and take only as needed when you feel you need them.   [name]  will generate for you a new user friendly medication calendar that will put us all on the  same page re: your medication use.     Without this process, it simply isn't possible to assure that we are providing  your outpatient care  with  the attention to detail we feel you deserve.   If we cannot assure that you're getting that kind of care,  then we cannot manage your problem effectively from this clinic.  Once you have seen [name] and we are sure that we're all on the same page with your medication use she will arrange follow up with me or with [name] or with Marchelle Gearingamaswamy if you want a second opinion here    Please remember to go to the lab and x-ray department downstairs in the basement  for your tests - we will call you with the results when they are available.

## 2017-10-06 NOTE — Telephone Encounter (Signed)
The problem is the nasal congestion so  The mask is what she needs and yes ok to adjust to keep sats above 88% (she is hypercarbic so > 88% is fine)

## 2017-10-06 NOTE — Telephone Encounter (Signed)
Called and spoke with Selena Batten with [employer], who had questions regarding mask order from 08/18/17. MW is requesting ventimask. Selena Batten states they also carry low flow mask. Kim states the low flow mask will delivery the same liter flow as pt's nasal cannula. Selena Batten also stated that [employer] [facility] can also go out and titrate pt's O2 to maintain above 90%.   MW please advise. Thanks.   Below I have copied order.  Note   Needs ventimask to use when nasal passages are clogged - ask [facility] to provide and advise on 02 flow to keep sats >90%     AVS 10/06/17 Patient Instructions    Goal is around 90% 02 sats and ok with me to ventimask instead of nasal prongs   Take delsym two tsp every 12 hours and supplement if needed with  tramadol 50 mg up to 2 every 4 hours to suppress the urge to cough. Swallowing water or using ice chips/non mint and menthol containing candies (such as lifesavers or sugarless jolly ranchers) are also effective.  You should rest your voice and avoid activities that you know make you cough.  Once you have eliminated the cough for 3 straight days try reducing the tramadol first,  then the delsym as tolerated.   Prefer a different blood pressure medication from norvasc (amlodipine) which in theory at least makes it harder for the lungs to provide adequate 02 delivery  Use the flutter valve as much as possible   Protonix 40 mg Take 30- 60 min before your first and last meals of the day  GERD (REFLUX)  is an extremely common cause of respiratory symptoms just like yours , many times with no obvious heartburn at all.    It can be treated with medication, but also with lifestyle changes including elevation of the head of your bed (ideally with 6 inch  bed blocks),  Smoking cessation, avoidance of late meals, excessive alcohol, and avoid fatty foods, chocolate, peppermint, colas, red wine, and acidic juices such as orange juice.  NO MINT OR MENTHOL PRODUCTS SO NO COUGH DROPS   USE SUGARLESS CANDY  INSTEAD (Jolley ranchers or Stover's or Life Savers) or even ice chips will also do - the key is to swallow to prevent all throat clearing. NO OIL BASED VITAMINS - use powdered substitutes.     Stop amlodipine   See [name] NP w/in 2 weeks (or first available) with all your medications, even over the counter meds, separated in two separate bags, the ones you take no matter what vs the ones you stop once you feel better and take only as needed when you feel you need them.   [name]  will generate for you a new user friendly medication calendar that will put [place] all on the same page re: your medication use.     Without this process, it simply isn't possible to assure that we are providing  your outpatient care  with  the attention to detail we feel you deserve.   If we cannot assure that you're getting that kind of care,  then we cannot manage your problem effectively from this clinic.  Once you have seen [name] and we are sure that we're all on the same page with your medication use she will arrange follow up with me or with Dr Jon Billings or with Marchelle Gearing if you want a second opinion here    Please remember to go to the lab and x-ray department downstairs in the basement  for your  tests - we will call you with the results when they are available.

## 2017-10-07 NOTE — Telephone Encounter (Signed)
Pt called to see if I had heard anything from [employer] about her mask.  I told her Selena BattenKim the therapist had called to verify if a different mask could be used and it appears that MW said that would be ok and they were notified of that at 3:30 yesterday afternoon.  I told her I would call [employer] and me or someone from there would call her back this afternoon.  [place] [facility].  She states mask was ordered & will be delivered tomorrow.  The respiratory therapist will deliver it to the pt tomorrow.  She is going to call the pt now & make her aware.  Nothing further needed.

## 2017-10-08 ENCOUNTER — Encounter: Payer: Self-pay | Admitting: Internal Medicine

## 2017-10-08 NOTE — Assessment & Plan Note (Signed)
May have mild component of chf  Though 11/18/16 echo looked ok x for diastolic dysfunction Grade 1  - since K low side rec avoid additional diuretics for now

## 2017-10-08 NOTE — Assessment & Plan Note (Addendum)
Surgical lung biopsy in March 2017 which showed nonspecific findings of honeycombing, organizing pneumonia, rare granulomas, and a lymphoplasmacytic infiltrate with ddx HP, underlying connective tissue dz or aspiratoin (neg swallow eval/ neg serologies noted) - last dumc 06/20/16 by Wynn Maudlin rec cellcept/ pred > pt states never responded as of 06/27/2017    - ESR  10/06/2017  = 59 rec double dose of pred (not sure what she's on but instructed to take twice as much until returns for med reconciliation      I strongly suspect she is not taking meds as rec so no further changes until returns with all meds in hand using a trust but verify approach to confirm accurate Medication  Reconciliation The principal here is that until we are certain that the  patients are doing what we've asked, it makes no sense to ask them to do more.    I had an extended discussion with the patient reviewing all relevant studies completed to date and  lasting 15 to 20 minutes of a 25 minute visit    Each maintenance medication was reviewed in detail including most importantly the difference between maintenance and prns and under what circumstances the prns are to be triggered using an action plan format that is not reflected in the computer generated alphabetically organized AVS.    Please see AVS for specific instructions unique to this visit that I personally wrote and verbalized to the the pt in detail and then reviewed with pt  by my nurse highlighting any  changes in therapy recommended at today's visit to their plan of care.

## 2017-10-08 NOTE — Assessment & Plan Note (Signed)
Trial off acei 10/06/2017 due to low bp and low 02 sats

## 2017-10-08 NOTE — Assessment & Plan Note (Deleted)
HC03   10/06/2017  = 34  - 10/06/2017 rec try off amlodipine since bp low and potential to nterfere  with hypoxic pulmonary vasoconstriction  

## 2017-10-08 NOTE — Assessment & Plan Note (Signed)
HC03   10/06/2017  = 34  - 10/06/2017 rec try off amlodipine since bp low and potential to nterfere  with hypoxic pulmonary vasoconstriction

## 2017-10-16 ENCOUNTER — Telehealth: Payer: Self-pay | Admitting: Family Medicine

## 2017-10-16 NOTE — Telephone Encounter (Signed)
Copied from CRM [id number]. Topic: Quick Communication - See Telephone Encounter >> Oct 16, 2017 10:16 AM Louie BunPalacios Medina, Judy Pimpleeresa D wrote: Enrique SackKendra from [facility] called wanting a verbal order from the provider. The order is as fallow: extend home health PT 2x for 4 weeks and 1x for 1 week. She also would like to check the status of her referral for patient to see ENT due to oxygen because patient has trouble breathing through nose. She can be reached at [phone]. CRM for notification. See Telephone encounter for: 10/16/17.

## 2017-10-17 ENCOUNTER — Other Ambulatory Visit: Payer: Self-pay | Admitting: Family Medicine

## 2017-10-17 DIAGNOSIS — R0689 Other abnormalities of breathing: Secondary | ICD-10-CM

## 2017-10-17 DIAGNOSIS — R0981 Nasal congestion: Secondary | ICD-10-CM

## 2017-10-17 NOTE — Telephone Encounter (Signed)
Called and spoke with Peggy Todd, provided verbal order for PT. Discussed patient's difficulty with breathing through nose, nasal congestion and decreased O2 sats when breathing through nose. ENT referral placed.

## 2017-10-18 ENCOUNTER — Encounter: Payer: Self-pay | Admitting: Internal Medicine

## 2017-10-20 ENCOUNTER — Ambulatory Visit (INDEPENDENT_AMBULATORY_CARE_PROVIDER_SITE_OTHER): Payer: Medicare Other | Admitting: Family Medicine

## 2017-10-20 ENCOUNTER — Ambulatory Visit (INDEPENDENT_AMBULATORY_CARE_PROVIDER_SITE_OTHER)
Admission: RE | Admit: 2017-10-20 | Discharge: 2017-10-20 | Disposition: A | Discharge status: Home / Self Care | Payer: Medicare Other | Source: Ambulatory Visit | Attending: Family Medicine | Admitting: Family Medicine

## 2017-10-20 ENCOUNTER — Encounter: Payer: Self-pay | Admitting: Family Medicine

## 2017-10-20 ENCOUNTER — Encounter: Payer: Self-pay | Admitting: Adult Health

## 2017-10-20 ENCOUNTER — Ambulatory Visit (INDEPENDENT_AMBULATORY_CARE_PROVIDER_SITE_OTHER): Payer: Medicare Other | Admitting: Adult Health

## 2017-10-20 VITALS — BP 138/76 | HR 122 | Temp 98.3°F | Wt 140.8 lb

## 2017-10-20 DIAGNOSIS — Z794 Long term (current) use of insulin: Secondary | ICD-10-CM

## 2017-10-20 DIAGNOSIS — M25552 Pain in left hip: Secondary | ICD-10-CM

## 2017-10-20 DIAGNOSIS — E1140 Type 2 diabetes mellitus with diabetic neuropathy, unspecified: Secondary | ICD-10-CM

## 2017-10-20 DIAGNOSIS — J849 Interstitial pulmonary disease, unspecified: Secondary | ICD-10-CM

## 2017-10-20 DIAGNOSIS — J9611 Chronic respiratory failure with hypoxia: Secondary | ICD-10-CM

## 2017-10-20 DIAGNOSIS — E876 Hypokalemia: Secondary | ICD-10-CM | POA: Diagnosis not present

## 2017-10-20 DIAGNOSIS — R058 Other specified cough: Secondary | ICD-10-CM

## 2017-10-20 DIAGNOSIS — R05 Cough: Secondary | ICD-10-CM | POA: Diagnosis not present

## 2017-10-20 LAB — HEMOGLOBIN A1C: HEMOGLOBIN A1C: 10.4 % — AB (ref 4.6–6.5)

## 2017-10-20 MED ORDER — TRAMADOL HCL 50 MG PO TABS: ORAL_TABLET | ORAL | 0 refills | Status: DC

## 2017-10-20 MED ORDER — PREDNISONE 10 MG PO TABS: 20.0000 mg | ORAL_TABLET | Freq: Every day | ORAL | 1 refills | Status: DC

## 2017-10-20 MED ORDER — PREGABALIN 100 MG PO CAPS: 100.0000 mg | ORAL_CAPSULE | Freq: Two times a day (BID) | ORAL | 1 refills | Status: AC

## 2017-10-20 MED ORDER — POTASSIUM CHLORIDE CRYS ER 20 MEQ PO TBCR: 20.0000 meq | EXTENDED_RELEASE_TABLET | Freq: Every day | ORAL | 1 refills | Status: DC

## 2017-10-20 MED ORDER — PREGABALIN 100 MG PO CAPS: 100.0000 mg | ORAL_CAPSULE | Freq: Two times a day (BID) | ORAL | 1 refills | Status: DC

## 2017-10-20 NOTE — Progress Notes (Signed)
@Patient  ID: Peggy Todd, female    DOB: 1950/11/15, 67 y.o.   MRN: [id number]  No chief complaint on file.   Referring provider: Emi Belfast, FNP  HPI: 67 year old female former smoker with underlying interstitial lung disease with workup in [place] and  [place].  She underwent a lung biopsy that showed interstitial fibrosis with acute and organizing pneumonia.  Honeycombing changes with lymphoplasma chronic inflammatory infiltratecytic.  (Biopsy report noted this may represent advanced usual interstitial pneumonia or may result of an ongoing connective tissue disease.  She is on CellCept and Prednisone .  Previous DVT in 2013 treated with Xarelto times 1 year. She is on oxygen 5l/m rest and 8lm act.   10/20/2017 Follow up ; ILD , O2 RF  Patient presents for a 2-week follow-up.  Patient was seen last visit with a suspected ILD flare.  She had increased shortness of breath.  Chest x-ray showed increased interstitial markings consistent with probable edema.  Labs did show an elevated sed rate at 59.  Her BNP was also minimally elevated.  Patient was recommended to stop amlodipine.  As she had a low to normal blood pressure.  Patient was recommend to continue on Lasix 60 mg.  Her potassium was low at 3.2.  Patient says she is feeling improved. She has decreased shortness of breath.  Cough is improving with tramadol and Delsym.  O2 saturations have been good on her chronic oxygen at 5 L at rest and 8 L at activity.  Patient's weight is down 5 pounds and she has no lower extremity edema.  She denies any chest pain orthopnea. We reviewed all her medications organize them into a med calendar with patient education.  She is accompanied by family members.  She does seem to be taking her medications correctly.     Allergies  Allergen Reactions  . Oxycodone Anxiety and Other (See Comments)    Patient states she cannot take/ makes her very confused!  . Penicillins Swelling  . Codeine Other  (See Comments) and Anxiety    INSOMNIA, m "makes me hyper"  . Hydrochlorothiazide Other (See Comments)    Abdominal pain  . Atorvastatin Other (See Comments)    Generalized myalgia    Immunization History  Administered Date(s) Administered  . Influenza, High Dose Seasonal PF 05/06/2016  . Influenza,inj,quad, With Preservative 06/30/2016  . Pneumococcal Polysaccharide-23 06/30/2013, 10/31/2014    Past Medical History:  Diagnosis Date  . Asthma   . Depression   . Diabetic peripheral angiopathy (HCC)   . Dvt femoral (deep venous thrombosis) (HCC) ?2015   LLE  . GERD (gastroesophageal reflux disease)   . High cholesterol   . History of stomach ulcers   . Hypertension   . On home oxygen therapy "   "4-5L; high w/activity" 07/09/2017)  . Pneumonia 2017  . Pulmonary embolism (HCC) ?2015  . Type II diabetes mellitus (HCC)     Tobacco History: Social History   Tobacco Use  Smoking Status Former Smoker  . Years: 10.00  . Types: Cigarettes  . Last attempt to quit: 1990  . Years since quitting: 29.0  Smokeless Tobacco Never Used   Counseling given: Not Answered   Outpatient Encounter Medications as of 10/20/2017  Medication Sig  . acetaminophen (TYLENOL) 500 MG tablet Take 1,000 mg by mouth every 6 (six) hours as needed.  Marland Kitchen albuterol (PROAIR HFA) 108 (90 Base) MCG/ACT inhaler Inhale 2 puffs into the lungs 4 (four) times daily as needed.  Marland Kitchen  clopidogrel (PLAVIX) 75 MG tablet Take 1 tablet (75 mg total) daily by mouth.  . Dextromethorphan-Guaifenesin (DELSYM CGH/CHEST CONG DM CHILD) 5-100 MG/5ML LIQD Take 10 mLs by mouth at bedtime.  . furosemide (LASIX) 20 MG tablet Take 30 mg by mouth.  . insulin aspart (NOVOLOG) 100 UNIT/ML injection Inject 4 Units into the skin 3 (three) times daily with meals.  . Insulin Detemir (LEVEMIR) 100 UNIT/ML Pen Inject 15 units in AM and 25 units in PM  . lansoprazole (PREVACID SOLUTAB) 15 MG disintegrating tablet Take 15 mg by mouth daily at 12  noon.  . mycophenolate (CELLCEPT) 500 MG tablet Take 1 tablet (500 mg total) by mouth 2 (two) times daily.  . nitroGLYCERIN (NITROSTAT) 0.4 MG SL tablet Place 0.4 mg under the tongue every 5 (five) minutes as needed for chest pain.  . OXYGEN 6lpm with rest and 8-10 lpm with exertion  . pantoprazole (PROTONIX) 40 MG tablet Take 30- 60 min before your first and last meals of the day  . polyethylene glycol (MIRALAX / GLYCOLAX) packet Take 17 g by mouth daily.  . predniSONE (DELTASONE) 10 MG tablet Take 1 tablet (10 mg total) by mouth daily with breakfast. (Patient taking differently: Take 20 mg by mouth daily with breakfast. )  . pregabalin (LYRICA) 100 MG capsule Take 1 capsule (100 mg total) by mouth 2 (two) times daily.  Marland Kitchen Respiratory Therapy Supplies (FLUTTER) DEVI Use as directed  . rosuvastatin (CRESTOR) 20 MG tablet Take 20 mg by mouth daily.  . sertraline (ZOLOFT) 25 MG tablet Take 25 mg by mouth daily.  . traMADol (ULTRAM) 50 MG tablet TAKE 1 TABLET BY MOUTH EVERY 6 HOURS AS NEEDED FOR COUGH OR PAIN   No facility-administered encounter medications on file as of 10/20/2017.      Review of Systems  Constitutional:   No  weight loss, night sweats,  Fevers, chills,  +fatigue, or  lassitude.  HEENT:   No headaches,  Difficulty swallowing,  Tooth/dental problems, or  Sore throat,                No sneezing, itching, ear ache, nasal congestion, post nasal drip,   CV:  No chest pain,  Orthopnea, PND, swelling in lower extremities, anasarca, dizziness, palpitations, syncope.   GI  No heartburn, indigestion, abdominal pain, nausea, vomiting, diarrhea, change in bowel habits, loss of appetite, bloody stools.   Resp:   No chest wall deformity  Skin: no rash or lesions.  GU: no dysuria, change in color of urine, no urgency or frequency.  No flank pain, no hematuria   MS:  No joint pain or swelling.  No decreased range of motion.  No back pain.    Physical Exam  BP 118/70 (BP  Location: Left Arm, Cuff Size: Normal)   Pulse (!) 108   Ht 5' (1.524 m)   Wt 140 lb 12.8 oz (63.9 kg)   SpO2 92%   BMI 27.50 kg/m   GEN: A/Ox3; pleasant , NAD, chronically ill-appearing on oxygen in  wheelchair   HEENT:  [place]/AT,  EACs-clear, TMs-wnl, NOSE-clear, THROAT-clear, no lesions, no postnasal drip or exudate noted.   NECK:  Supple w/ fair ROM; no JVD; normal carotid impulses w/o bruits; no thyromegaly or nodules palpated; no lymphadenopathy.    RESP  Decreased BS in bases   no accessory muscle use, no dullness to percussion  CARD:  RRR, no m/r/g, no peripheral edema, pulses intact, no cyanosis or clubbing.  GI:  Soft & nt; nml bowel sounds; no organomegaly or masses detected.   Musco: Warm bil, no deformities or joint swelling noted.   Neuro: alert, no focal deficits noted.    Skin: Warm, no lesions or rashes    Lab Results:  CBC    Component Value Date/Time   WBC 23.4 Repeated and verified X2. (HH) 10/06/2017 0935   RBC 4.65 10/06/2017 0935   HGB 13.4 10/06/2017 0935   HCT 42.3 10/06/2017 0935   PLT 245.0 10/06/2017 0935   MCV 90.9 10/06/2017 0935   MCH 28.5 07/12/2017 0533   MCHC 31.5 10/06/2017 0935   RDW 14.5 10/06/2017 0935   LYMPHSABS 3.1 10/06/2017 0935   MONOABS 1.7 (H) 10/06/2017 0935   EOSABS 0.3 10/06/2017 0935   BASOSABS 0.1 10/06/2017 0935    BMET    Component Value Date/Time   NA 141 10/06/2017 0935   K 3.2 (L) 10/06/2017 0935   CL 91 (L) 10/06/2017 0935   CO2 34 (H) 10/06/2017 0935   GLUCOSE 236 (H) 10/06/2017 0935   BUN 21 10/06/2017 0935   CREATININE 1.04 10/06/2017 0935   CREATININE 1.18 (H) 11/05/2016 1628   CALCIUM 9.9 10/06/2017 0935   GFRNONAA 57 (L) 07/12/2017 0533   GFRNONAA 49 (L) 11/05/2016 1628   GFRAA >60 07/12/2017 0533   GFRAA 56 (L) 11/05/2016 1628    BNP No results found for: BNP  ProBNP    Component Value Date/Time   PROBNP 207.0 (H) 10/06/2017 0935    Imaging: Dg Chest 2 View  Result Date:  10/06/2017 CLINICAL DATA:  Chronic cough EXAM: CHEST  2 VIEW COMPARISON:  07/10/2017 FINDINGS: Cardiomegaly. Diffuse interstitial opacities throughout the lungs. Findings likely reflect interstitial edema. No visible effusions or acute bony abnormality. IMPRESSION: Severe diffuse bilateral interstitial opacities, likely interstitial edema/CHF. Electronically Signed   By: Charlett NoseKevin  Dover M.D.   On: 10/06/2017 09:45   Dg Hip Unilat With Pelvis 2-3 Views Left  Result Date: 10/20/2017 CLINICAL DATA:  Left hip pain EXAM: DG HIP (WITH OR WITHOUT PELVIS) 2-3V LEFT COMPARISON:  None. FINDINGS: No degenerative hip narrowing or spurring. No fracture deformity or evidence of bone lesion. Notable L4-5 disc degeneration with asymmetric left-sided height loss and mild rightward translation. Mild lumbar dextroscoliosis. Partly seen IVC filter. IMPRESSION: 1. No acute finding or degenerative hip narrowing. 2. Lumbar dextroscoliosis with notable L4-5 disc degeneration and left-sided collapse. Electronically Signed   By: Marnee SpringJonathon  Watts M.D.   On: 10/20/2017 09:04     Assessment & Plan:   No problem-specific Assessment & Plan notes found for this encounter.     Rubye Oaksammy Parrett, NP 10/20/2017

## 2017-10-20 NOTE — Patient Instructions (Signed)
Please follow up in 3 months.  

## 2017-10-20 NOTE — Assessment & Plan Note (Signed)
Recent exacerbation with suspected volume overload.  patient is improving on current regimen.  She will continue on prednisone 20 mg daily for now.  Continue current dose of Lasix.  Blood pressures improved off of amlodipine.  .Patient's medications were reviewed today and patient education was given. Computerized medication calendar was adjusted/completed   Plan  Patient Instructions  Begin K Dur 20meq  1 daily .  Follow med calendar closely and bring  To each visit .  Follow up in 2 weeks with lab/bmet and As needed  With Dr. Sherene SiresWert   Please contact office for sooner follow up if symptoms do not improve or worsen or seek emergency care

## 2017-10-20 NOTE — Assessment & Plan Note (Signed)
Most likely secondary to underlying interstitial lung disease.  Patient can use Delsym tramadol and flutter valve along with Lyrica.  Follow-up in 2 weeks

## 2017-10-20 NOTE — Assessment & Plan Note (Signed)
Continue on current regimen with oxygen 5 L at rest and 8 L with activity

## 2017-10-20 NOTE — Progress Notes (Signed)
Subjective:    Patient ID: Peggy Todd, female    DOB: 01-25-1951, 67 y.o.   MRN: [id number]  HPI This is a 67 yo female who presents today for follow up. She is accompanied by her daughter and family friend. Feels pretty good.   Recently saw Dr. Sherene Sires, increased prednisone to 20 mg daily. Little cough. No wheeze. Taking Delsym and Tramadol. Continues to have low sats on oxygen with any activity.   Blood sugars 70- 300s. Still using levemir 15 units am/ 25 units pm and SS novolog. Due to have hemoglobin A1C checked today.   Left hip and leg pain daily. Started a couple months ago. All the time. Taking tylenol, tramadol, heat without relief. Using a veterinary linement with some relief. Pain with putting weight on leg. Has chronic sciatica. Numbness, tingling and stiffness. No falls in several months.   Past Medical History:  Diagnosis Date  . Asthma   . Depression   . Diabetic peripheral angiopathy (HCC)   . Dvt femoral (deep venous thrombosis) (HCC) ?2015   LLE  . GERD (gastroesophageal reflux disease)   . High cholesterol   . History of stomach ulcers   . Hypertension   . On home oxygen therapy "   "4-5L; high w/activity" 07/09/2017)  . Pneumonia 2017  . Pulmonary embolism (HCC) ?2015  . Type II diabetes mellitus (HCC)    Past Surgical History:  Procedure Laterality Date  . APPENDECTOMY    . BACK SURGERY    . CORONARY ANGIOPLASTY WITH STENT PLACEMENT  12/2016   "4 stents"  . ESOPHAGOGASTRODUODENOSCOPY (EGD) WITH ESOPHAGEAL DILATION  X 2  . LAPAROSCOPIC CHOLECYSTECTOMY    . LUMBAR DISC SURGERY    . TOTAL ABDOMINAL HYSTERECTOMY    . TUBAL LIGATION     Family History  Problem Relation Age of Onset  . Heart disease Mother   . Stroke Mother   . Heart disease Father    Social History   Tobacco Use  . Smoking status: Former Smoker    Years: 10.00    Types: Cigarettes    Last attempt to quit: 1990    Years since quitting: 29.0  . Smokeless tobacco: Never Used    Substance Use Topics  . Alcohol use: No  . Drug use: No      Review of Systems  Constitutional: Negative for fever.  Respiratory: Positive for cough (has improved) and shortness of breath. Negative for wheezing.   Cardiovascular: Negative for chest pain and leg swelling.  Gastrointestinal:       Appetite normal.        Objective:   Physical Exam  Constitutional: She is oriented to person, place, and time. No distress.  Frail appearing, in wheelchair, easily winded with activity.   HENT:  Head: Normocephalic and atraumatic.  Eyes: Conjunctivae are normal.  Cardiovascular: Normal rate, regular rhythm and normal heart sounds.  Pulmonary/Chest:  Effort normal at rest. No wheezes, rales or rhonchi.   Musculoskeletal: She exhibits no edema.       Left hip: She exhibits tenderness and bony tenderness.  Difficult to examine in wheelchair.   Neurological: She is alert and oriented to person, place, and time.  Skin: Skin is warm and dry. She is not diaphoretic. There is cyanosis (mild of hands).  Psychiatric: She has a normal mood and affect. Her behavior is normal. Judgment and thought content normal.  Vitals reviewed.     BP 138/76   Pulse (!) 122  Temp 98.3 F (36.8 C) (Oral)   Wt 140 lb 12 oz (63.8 kg)   SpO2 (!) 60%   BMI 27.49 kg/m  Wt Readings from Last 3 Encounters:  10/20/17 140 lb 12 oz (63.8 kg)  10/06/17 145 lb (65.8 kg)  09/07/17 149 lb (67.6 kg)   Recheck pulse ox 87%, HR 112.     Assessment & Plan:  1. ILD (interstitial lung disease) (HCC) - she is following closely with pulmonary and has appointment today  2. Left hip pain - given long term prednisone use, will check xray - DG HIP UNILAT WITH PELVIS 2-3 VIEWS LEFT; Future  3. Type 2 diabetes mellitus with diabetic neuropathy, with long-term current use of insulin (HCC) - Hemoglobin A1c - blood sugar readings all over the place, likely to worsen on increased prednisone. Will check hemoglobin A1c  And adjust insulin as needed.  - follow up in 3 months  Olean Reeeborah Gessner, FNP-BC  [facility], [facility]  10/20/2017 1:57 PM

## 2017-10-20 NOTE — Assessment & Plan Note (Signed)
Hypokalemia on daily dosing of Lasix.  Will add an K-Dur 20 mEq daily.  Kidney function is ok. Will check bmet in 2 weeks .

## 2017-10-20 NOTE — Progress Notes (Signed)
Chart and office note reviewed in detail  > agree with a/p as outlined    

## 2017-10-20 NOTE — Patient Instructions (Addendum)
Begin K Dur 20meq  1 daily .  Follow med calendar closely and bring  To each visit .  Follow up in 2 weeks with lab/bmet and As needed  With Dr. Sherene SiresWert   Please contact office for sooner follow up if symptoms do not improve or worsen or seek emergency care

## 2017-10-22 ENCOUNTER — Telehealth: Payer: Self-pay | Admitting: Family Medicine

## 2017-10-22 NOTE — Telephone Encounter (Signed)
Copied from CRM [id number]. Topic: Quick Communication - See Telephone Encounter >> Oct 22, 2017  4:34 PM Arlyss Gandyichardson, Awanda Wilcock N, NT wrote: CRM for notification. See Telephone encounter for: Enrique SackKendra from [facility] was returning call to Olean Reeeborah Gessner. She states that she will see her on Monday for PT. Enrique SackKendra went on vacation during Christmas and another therapist filled in for her. They forgot to get more orders for this pt. She apologizes. She states she just caught the mistake last week but she will see pt on Monday. CB#: [phone]  10/22/17.

## 2017-10-22 NOTE — Addendum Note (Signed)
Addended by: Etheleen MayhewOX, HEATHER C on: 10/22/2017 02:52 PM   Modules accepted: Orders

## 2017-10-27 ENCOUNTER — Encounter: Payer: Self-pay | Admitting: Internal Medicine

## 2017-10-27 DIAGNOSIS — J9611 Chronic respiratory failure with hypoxia: Secondary | ICD-10-CM

## 2017-10-27 DIAGNOSIS — J9612 Chronic respiratory failure with hypercapnia: Principal | ICD-10-CM

## 2017-11-04 NOTE — Telephone Encounter (Signed)
MW please advise- pt is requesting a refill on Tramadol, as well as a new order for a cpap mask, and an order for a home fill O2 system.    Thanks!

## 2017-11-04 NOTE — Telephone Encounter (Signed)
Fine to order all the requested rx

## 2017-11-05 NOTE — Telephone Encounter (Signed)
[facility] and home fill [facility]  Waiting on email back from pt on which pharm to use for the tramadol

## 2017-11-06 ENCOUNTER — Other Ambulatory Visit: Payer: Self-pay

## 2017-11-06 MED ORDER — TRAMADOL HCL 50 MG PO TABS: ORAL_TABLET | ORAL | 0 refills | Status: DC

## 2017-11-10 ENCOUNTER — Ambulatory Visit (INDEPENDENT_AMBULATORY_CARE_PROVIDER_SITE_OTHER): Payer: Medicare Other | Admitting: Internal Medicine

## 2017-11-10 ENCOUNTER — Other Ambulatory Visit (INDEPENDENT_AMBULATORY_CARE_PROVIDER_SITE_OTHER): Payer: Medicare Other

## 2017-11-10 ENCOUNTER — Encounter: Payer: Self-pay | Admitting: Internal Medicine

## 2017-11-10 VITALS — BP 118/76 | HR 100 | Ht 60.0 in | Wt 140.0 lb

## 2017-11-10 DIAGNOSIS — I10 Essential (primary) hypertension: Secondary | ICD-10-CM

## 2017-11-10 DIAGNOSIS — J849 Interstitial pulmonary disease, unspecified: Secondary | ICD-10-CM | POA: Diagnosis not present

## 2017-11-10 DIAGNOSIS — R05 Cough: Secondary | ICD-10-CM | POA: Diagnosis not present

## 2017-11-10 DIAGNOSIS — J9611 Chronic respiratory failure with hypoxia: Secondary | ICD-10-CM | POA: Diagnosis not present

## 2017-11-10 DIAGNOSIS — J9612 Chronic respiratory failure with hypercapnia: Secondary | ICD-10-CM

## 2017-11-10 DIAGNOSIS — R058 Other specified cough: Secondary | ICD-10-CM

## 2017-11-10 LAB — CBC WITH DIFFERENTIAL/PLATELET
BASOS PCT: 0.2 %
Basophils Absolute: 36 cells/uL (ref 0–200)
EOS PCT: 1 %
Eosinophils Absolute: 179 cells/uL (ref 15–500)
HCT: 41.5 % (ref 35.0–45.0)
HEMOGLOBIN: 13.6 g/dL (ref 11.7–15.5)
Lymphs Abs: 1647 cells/uL (ref 850–3900)
MCH: 28.6 pg (ref 27.0–33.0)
MCHC: 32.8 g/dL (ref 32.0–36.0)
MCV: 87.4 fL (ref 80.0–100.0)
MONOS PCT: 4.5 %
MPV: 12.1 fL (ref 7.5–12.5)
NEUTROS ABS: 15233 {cells}/uL — AB (ref 1500–7800)
Neutrophils Relative %: 85.1 %
Platelets: 231 10*3/uL (ref 140–400)
RBC: 4.75 10*6/uL (ref 3.80–5.10)
RDW: 13.5 % (ref 11.0–15.0)
Total Lymphocyte: 9.2 %
WBC mixed population: 806 cells/uL (ref 200–950)
WBC: 17.9 10*3/uL — ABNORMAL HIGH (ref 3.8–10.8)

## 2017-11-10 LAB — SEDIMENTATION RATE: SED RATE: 17 mm/h (ref 0–30)

## 2017-11-10 LAB — BASIC METABOLIC PANEL
BUN: 21 mg/dL (ref 6–23)
CALCIUM: 9.6 mg/dL (ref 8.4–10.5)
CHLORIDE: 96 meq/L (ref 96–112)
CO2: 35 meq/L — AB (ref 19–32)
Creatinine, Ser: 0.86 mg/dL (ref 0.40–1.20)
GFR: 70.04 mL/min (ref >60.00)
Glucose, Bld: 178 mg/dL — ABNORMAL HIGH (ref 70–99)
Potassium: 5 mEq/L (ref 3.5–5.1)
SODIUM: 143 meq/L (ref 135–145)

## 2017-11-10 NOTE — Progress Notes (Signed)
Spoke with pt and notified of results per Dr. Wert. Pt verbalized understanding and denied any questions. 

## 2017-11-10 NOTE — Progress Notes (Signed)
Subjective:    Patient ID: Peggy Todd, female    DOB: 21-Nov-1950, 67 y.o.   MRN: [id number]    Brief patient profile:  Initial eval Peggy Todd  11/05/16 This is the case of Peggy Todd, 24QAST remote smoker, who made appoointment for cough. She was living and getting her care at [facility] but her daughter decided to bring her to [place] where she lives.       Patient has a 20 PY smoking history, quit in her 86s. She was diagnosed with asthma, adult onset.  Triggers : pollen. She was on singulair but was discontinued. Uses proair daily. Asthma has been stable.    Patent had a provoked DVT in her LLL in 2013 for which she was on blood thinner for a year.   She was admitted in January 2017 for bronchitis/PNA episode. >> discharged with 3L O2 24/7.  She ended up going to [facility] in 02/2016.  She was seen by Peggy. Wynn Todd. She had bronchoscopy with biopsy in August 2017.  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 Reviewing records from [facility], patient's initial presentation was 10/2015 for walking PNA. She had a surgical lung biopsy in March 2017 which showed nonspecific findings of honeycombing, organizing pneumonia, rare granulomas, and a lymphoplasmacytic infiltrate.  She was treated with prednisone, initially from 60 mg/d, cutting down to 20 mg/d, eventually MMF 500 mg BID was added.  Her O2 requirement increased as well as her SOB.   FVC was 49%, DLCO was 41%.  CT scan was nonspecific with patchy ground glass. ESR was 23, CRP 1.1. Serologies were negative. There was concern for microaspiration causing her pulmonary issues to get worse. Swallow study was unremarkable.  Last ov 06/20/16 Peggy Todd rec Use oxygen when you walk. You need up to 8 LPM with exertion. I will send a new prescription to [place]. Increase cellcept to 1500 mg twice daily (3 tablets twice). Continue prednisone. I have added an antibiotic to prevent some - but not all - infections (bactrim)    F/u 3 mon > not done    at ov with Peggy Todd reported  She was given Cellcept 1500 BID which she ended up taking for a year with no significant change per patient. She was also given prednisone (as high as 40 mg for several weeks) with no significant change. She has since been on Prednisone 10 mg daily rec Continue using your  oxygen as you have been. Continue your  current medicines for your reflux and cough. Echo > diastolic dysfunction gr I , no PH  11/18/16  pfts rec/ not done        At end of stent at [facility] could walk 8lpm room to room at Nov 3rd      06/27/2017  f/u ov/Peggy Todd re:  resp failure of ? Etiology/ multiple centers of care/ confused with instructions per Peggy Todd note Chief Complaint  Patient presents with  . Follow-up    Former patient of Peggy Peggy Todd. She c/o increased SOB and prod cough with brown sputum.    Some better in April 2018 p stents on / never <prednisone 10 since started and back on cell cept x one month> no better protonix no timing with meals At end of admit 06/21/17 in [place]  no better p admit >  Swallowing better since EGD/ dilation 06/19/17 (but study was nl) and on protonix but not timed ac  On high dose norvasc with desats on "  10 liters" x across the room  Notes brown mucus "maybe once a week"  rec Adjust 02 to keep it > 88% as the goal  Reduce the amlodipine 10 mg to one half daily (high doses may contribute to low 02) Protonix 40 mg Take 30- 60 min before your first and last meals of the day  GERD Take delsym two tsp every 12 hours and supplement if needed with  tramadol 50 mg up to 1- 2 every 4 hours to suppress the urge to cough.  Once you have eliminated the cough for 3 straight days try reducing the tramadol first,  then the delsym as tolerated.   Please remember to go to the lab and x-ray department downstairs in the basement  for your tests - we will call you with the results when they are available. Please see patient coordinator before you  leave today  to schedule humidity for your 02 and an Internal medicine evaluation See Peggy Todd w/in 2 weeks   Once you have seen Peggy and we are sure that we're all on the same page with your medication use she will arrange follow up with Peggy Todd   Late add: did not go to xray or lab as rec > did not return to Peggy Todd for med rec     08/18/2017  f/u ov/Peggy Todd re:  PF /02 dep / pred at 10 mg per day  Chief Complaint  Patient presents with  . Acute Visit    Pt having alot of nasal congestion and cough since the last visit. Her sats are dropping b/c she is unable to breathe through her nose. She is coughing up some yellow/brown to clear sputum.    Doe x across the room / lots of cough daytime cough  > noct min productive in am only  rec Goal is around 90% 02 sats and ok with me to ventimask instead of nasal prongs  Take delsym two tsp every 12 hours and supplement if needed with  tramadol 50 mg up to 2 every 4 hours  Use the flutter valve as much as possible  Protonix 40 mg Take 30- 60 min before your first and last meals of the day GERD diet   Late Add:  Consider return to Peggy Todd vs hospice approach discuss at next ov vs Peggy Todd eval depending on pt's wishes.     10/06/2017  f/u ov/Peggy Todd re:   PF/ 02 dep/ Pred @ ? rx   plus cell cept  Chief Complaint  Patient presents with  . Follow-up    Breathing is progressively worse and she is coughing more. Cough is usually non prod but will occ produce some white sputum.  Sats after stepping on the scale here this today were 55% 5lpm--increased to 88% 6lpm continuous.   cough is worse during the day and minimally prod mucoid sputum  Sleeping 45 degrees Confused with  meds, not sure how much pred she's taking/ only using one or two tramadol a day max to control cough  Planned to get facemask at last ov due to nasal obst but did not call when DME company failed to deliver one rec Goal is around 90% 02 sats and ok with me to ventimask  instead of nasal prongs  Take delsym two tsp every 12 hours and supplement if needed with  tramadol 50 mg up to 2 every 4 hours   Use the flutter valve as much as possible  Protonix 40 mg Take  30- 60 min before your first and last meals of the day GERD  Diet  Stop amlodipine  See Peggy Todd w/in 2 weeks (or first available)    Todd 10/20/17 eval rec Begin K Dur 67mq  1 daily .  Follow med calendar closely and bring  To each visit .  Follow up in 2 weeks with lab/bmet and As needed  With Peggy. WMelvyn Novas     11/10/2017  f/u ov/Peggy Todd re:  PF / chronic resp failure still on pred 20 mg daily  Dyspnea:  Room to room on 5 - 7 lpm  Cough: ok as long as takes tramadaol Sleep: ok on 5lpm 45 degrees   No obvious day to day or daytime variability or assoc excess/ purulent sputum or mucus plugs or hemoptysis or cp or chest tightness, subjective wheeze or overt sinus or hb symptoms. No unusual exposure hx or h/o childhood pna/ asthma or knowledge of premature birth.  Sleeping ok 45 degrees/ 5lpm without nocturnal  or early am exacerbation  of respiratory  c/o's or need for noct saba. Also denies any obvious fluctuation of symptoms with weather or environmental changes or other aggravating or alleviating factors except as outlined above   Current Allergies, Complete Past Medical History, Past Surgical History, Family History, and Social History were reviewed in [employer].  ROS  The following are not active complaints unless bolded Hoarseness, sore throat, dysphagia, dental problems, itching, sneezing,  nasal congestion or discharge of excess mucus or purulent secretions, ear ache,   fever, chills, sweats, unintended wt loss or wt gain, classically pleuritic or exertional cp,  orthopnea pnd or leg swelling, presyncope, palpitations, abdominal pain, anorexia, nausea, vomiting, diarrhea  or change in bowel habits or change in bladder habits, change in stools or change in urine, dysuria,  hematuria,  rash, arthralgias, visual complaints, headache, numbness, weakness or ataxia or problems with walking or coordination,  change in mood/affect or memory.        Current Meds  Medication Sig  . acetaminophen (TYLENOL) 500 MG tablet Take 1,000 mg by mouth every 6 (six) hours as needed.  .Marland Kitchenalbuterol (PROAIR HFA) 108 (90 Base) MCG/ACT inhaler Inhale 2 puffs into the lungs 4 (four) times daily as needed.  . clopidogrel (PLAVIX) 75 MG tablet Take 1 tablet (75 mg total) daily by mouth.  . Dextromethorphan-Guaifenesin (DELSYM CGH/CHEST CONG DM CHILD) 5-100 MG/5ML LIQD Take 10 mLs by mouth at bedtime.  . furosemide (LASIX) 20 MG tablet Take 30 mg by mouth 3 (three) times daily.   . insulin aspart (NOVOLOG) 100 UNIT/ML injection Inject 4 Units into the skin 3 (three) times daily with meals.  . Insulin Detemir (LEVEMIR) 100 UNIT/ML Pen Inject 15 units in AM and 25 units in PM  . mycophenolate (CELLCEPT) 500 MG tablet Take 1 tablet (500 mg total) by mouth 2 (two) times daily.  . nitroGLYCERIN (NITROSTAT) 0.4 MG SL tablet Place 0.4 mg under the tongue every 5 (five) minutes as needed for chest pain.  . OXYGEN 6lpm with rest and 8-10 lpm with exertion  . pantoprazole (PROTONIX) 40 MG tablet Take 30- 60 min before your first and last meals of the day  . polyethylene glycol (MIRALAX / GLYCOLAX) packet Take 17 g by mouth daily.  . potassium chloride SA (K-DUR,KLOR-CON) 20 MEQ tablet Take 1 tablet (20 mEq total) by mouth daily.  . predniSONE (DELTASONE) 10 MG tablet Take 2 tablets (20 mg total) by mouth daily  with breakfast.  . pregabalin (LYRICA) 100 MG capsule Take 1 capsule (100 mg total) by mouth 2 (two) times daily.  . rosuvastatin (CRESTOR) 20 MG tablet Take 20 mg by mouth daily.  . sertraline (ZOLOFT) 25 MG tablet Take 25 mg by mouth daily.  . traMADol (ULTRAM) 50 MG tablet TAKE 1 TABLET BY MOUTH EVERY 6 HOURS AS NEEDED FOR COUGH OR PAIN                 Objective:   Physical  Exam  wc bound slt cushingnoid wf nad at rest     11/10/2017       140        08/18/2017     149   06/27/17 158 lb (71.7 kg)  11/05/16 171 lb 12.8 oz (77.9 kg)    Vital signs reviewed - Note on arrival 02 sats  91% on 5lpm continuous and  bp    118/76     HEENT: nl dentition, and oropharynx. Nl external ear canals without cough reflex- moderate bilateral non-specific turbinate edema    NECK :  without JVD/Nodes/TM/ nl carotid upstrokes bilaterally   LUNGS: no acc muscle use,  Nl contour chest with diffuse insp crackles/ no cough on insp   CV:  RRR  no s3 or murmur or increase in P2, and trace bilateral pitting pedal edema    HEENT: nl dentition, turbinates bilaterally, and oropharynx. Nl external ear canals without cough reflex   NECK :  without JVD/Nodes/TM/ nl carotid upstrokes bilaterally   LUNGS: no acc muscle use,  Nl contour chest diffuse distant insp crackles but no cough on insp   CV:  RRR  no s3 or murmur or increase in P2, and trace sym pedal edema   ABD:  soft and nontender with nl inspiratory excursion in the supine position. No bruits or organomegaly appreciated, bowel sounds nl  MS:   ext warm without deformities, calf tenderness, cyanosis or clubbing No obvious joint restrictions   SKIN: warm and dry without lesions    NEURO:  alert, approp, nl sensorium with  no motor or cerebellar deficits apparent.         Lab Results  Component Value Date   ESRSEDRATE 17 11/10/2017   ESRSEDRATE 59 (H) 10/06/2017        Chemistry      Component Value Date/Time   NA 143 11/10/2017 1028   K 5.0 11/10/2017 1028   CL 96 11/10/2017 1028   CO2 35 (H) 11/10/2017 1028   BUN 21 11/10/2017 1028   CREATININE 0.86 11/10/2017 1028   CREATININE 1.18 (H) 11/05/2016 1628      Component Value Date/Time   CALCIUM 9.6 11/10/2017 1028                                  Lab Results  Component Value Date   WBC 17.9 (H) 11/10/2017   HGB 13.6 11/10/2017   HCT 41.5  11/10/2017   MCV 87.4 11/10/2017   PLT 231 11/10/2017           Assessment & Plan:

## 2017-11-10 NOTE — Patient Instructions (Addendum)
Contact [name] re refills on Cellcept and need for re-evaluation    Contact [facility] re facemask and let me know who you speak to there if they are not responsive    Please remember to go to the lab department downstairs in the basement  for your tests - we will call you with the results when they are available.     Please schedule a follow up visit in 3 months but call sooner if needed  with all medications /inhalers/ solutions in hand so we can verify exactly what you are taking. This includes all medications from all doctors and over the counters

## 2017-11-11 NOTE — Progress Notes (Signed)
Pt already notified of results

## 2017-11-12 ENCOUNTER — Encounter: Payer: Self-pay | Admitting: Internal Medicine

## 2017-11-12 NOTE — Assessment & Plan Note (Signed)
Surgical lung biopsy in March 2017 which showed nonspecific findings of honeycombing, organizing pneumonia, rare granulomas, and a lymphoplasmacytic infiltrate with ddx HP, underlying connective tissue dz or aspiratoin (neg swallow eval/ neg serologies noted) - last dumc 06/20/16 by Wynn Maudlin rec cellcept/ pred > pt states never responded as of 06/27/2017  - ESR  10/06/2017  = 59 rec double dose of pred (not sure what she's on but instructed to take twice as much until returns for med reconciliation  - ESR 11/10/2017   = 17 on pred 20 mg daily and slt improved sats > no change rx    Adequate control on present rx, reviewed in detail with pt > no change in rx at this point as very tenuous from an oxygenation issue and has not return to [facility] for Dr Dennard Nip opinion on what if anything else we can offer other than cellcept and pred so rec no change in rx pending f/u there  F/u here q 3 m   I had an extended discussion with the patient reviewing all relevant studies completed to date and  lasting 15 to 20 minutes of a 25 minute visit    Each maintenance medication was reviewed in detail including most importantly the difference between maintenance and prns and under what circumstances the prns are to be triggered using an action plan format that is not reflected in the computer generated alphabetically organized AVS.    Please see AVS for specific instructions unique to this visit that I personally wrote and verbalized to the the pt in detail and then reviewed with pt  by my nurse highlighting any  changes in therapy recommended at today's visit to their plan of care.

## 2017-11-12 NOTE — Assessment & Plan Note (Addendum)
Echo 11/18/16 Left ventricle: The cavity size was normal. There was mild   concentric hypertrophy. Systolic function was vigorous. The   estimated ejection fraction was in the range of 65% to 70%. Wall   motion was normal; there were no regional wall motion   abnormalities. Doppler parameters are consistent with abnormal   left ventricular relaxation (grade 1 diastolic dysfunction).   Doppler parameters are consistent with elevated ventricular   end-diastolic filling pressure. - Aortic valve: Trileaflet; normal thickness leaflets. There was no   regurgitation. - Aortic root: The aortic root was normal in size. - Mitral valve: There was trivial regurgitation. - Left atrium: The atrium was normal in size. - Right ventricle: Systolic function was normal. - Tricuspid valve: There was mild regurgitation. - Pulmonic valve: There was no regurgitation. - Pulmonary arteries: Systolic pressure was within the normal   range. - Inferior vena cava: The vessel was normal in size. - Pericardium, extracardiac: There was no pericardial effusion. Trial off amlodipine  10/06/2017 due to low bp and    low 02 sats    Adequate control on present rx, reviewed in detail with pt > no change in rx needed

## 2017-11-12 NOTE — Assessment & Plan Note (Signed)
HC03   10/06/2017  = 34  10/06/2017 rec try off amlodipine since bp low and potential to nterfere  with hypoxic pulmonary vasoconstriction  HC03  11/10/2017   = 35  Though somewhat paradoxic, when the lung fails to clear C02 properly and pC02 rises the lung then becomes a more efficient scavenger of C02 allowing lower work of breathing and  better C02 clearance albeit at a higher serum pC02 level - this is why pts can look a lot better than their ABG's would suggest and why it's so difficult to prognosticate endstage dz.  It's also why I strongly rec DNI status (ventilating pts down to a nl pC02 adversely affects this compensatory mechanism)   No change in rx other than to try to get 02 via mask to reduce nasal congestion

## 2017-11-12 NOTE — Assessment & Plan Note (Signed)
Max rx for gerd/ tramadol for cyclical coughing 06/27/2017 >>> - added flutter valve 08/18/2017     Adequate control on present rx, reviewed in detail with pt > no change in rx needed

## 2017-11-19 ENCOUNTER — Telehealth: Payer: Self-pay | Admitting: Family Medicine

## 2017-11-19 NOTE — Telephone Encounter (Signed)
[facility] provided [facility]

## 2017-11-19 NOTE — Telephone Encounter (Signed)
Please call and give ok for verbal order.

## 2017-11-19 NOTE — Telephone Encounter (Signed)
Copied from CRM [id number]. Topic: General - Other >> Nov 19, 2017  1:29 PM Stephannie LiSimmons, Shashana Fullington L, NT wrote: Reason for CRM: Physical therapy from [facility] called and needs verbal orders to extend her home health  physical therapy 2 x weeks for 2  week and then 1 x a week for 2 weeks   please call [phone] to

## 2017-11-23 ENCOUNTER — Other Ambulatory Visit: Payer: Self-pay | Admitting: Internal Medicine

## 2017-11-24 ENCOUNTER — Other Ambulatory Visit: Payer: Self-pay | Admitting: Internal Medicine

## 2017-11-25 MED ORDER — TRAMADOL HCL 50 MG PO TABS: ORAL_TABLET | ORAL | 0 refills | Status: DC

## 2017-11-25 NOTE — Telephone Encounter (Signed)
Dr. Sherene SiresWert please advise on refill. Thanks!

## 2017-12-02 ENCOUNTER — Telehealth: Payer: Self-pay | Admitting: Internal Medicine

## 2017-12-02 DIAGNOSIS — J849 Interstitial pulmonary disease, unspecified: Secondary | ICD-10-CM

## 2017-12-02 NOTE — Telephone Encounter (Signed)
Order has been resent to [facility] for mask and home fill system. Pt is aware and voiced her understanding. Nothing further is needed.

## 2017-12-02 NOTE — Telephone Encounter (Signed)
yes

## 2017-12-02 NOTE — Telephone Encounter (Signed)
Called and spoke with patient she states that she called [facility] in regards to the prescription that was sent over for the mask. They told her that he did not sign the prescription and that is why she has not received it. MW please advise are you ok with us resending the order for the face mask.

## 2017-12-05 ENCOUNTER — Other Ambulatory Visit: Payer: Self-pay | Admitting: Internal Medicine

## 2017-12-06 ENCOUNTER — Encounter: Payer: Self-pay | Admitting: Family Medicine

## 2017-12-06 ENCOUNTER — Other Ambulatory Visit: Payer: Self-pay | Admitting: Internal Medicine

## 2017-12-08 ENCOUNTER — Encounter: Payer: Self-pay | Admitting: Internal Medicine

## 2017-12-08 ENCOUNTER — Other Ambulatory Visit: Payer: Self-pay | Admitting: *Deleted

## 2017-12-08 MED ORDER — TRAMADOL HCL 50 MG PO TABS: ORAL_TABLET | ORAL | 0 refills | Status: DC

## 2017-12-08 MED ORDER — FUROSEMIDE 20 MG PO TABS: 60.0000 mg | ORAL_TABLET | Freq: Every day | ORAL | 0 refills | Status: DC

## 2017-12-08 NOTE — Telephone Encounter (Signed)
Dr. Sherene SiresWert please advise on refill of Tramadol. Thanks.

## 2017-12-08 NOTE — Telephone Encounter (Signed)
Ok with me 

## 2017-12-08 NOTE — Telephone Encounter (Signed)
-----   Message -----  From: Peggy Todd  Sent: 3/11/20191:06 PM EDT  To: Sandrea HughsMichael Wert, MD Subject: Non-Urgent Medical Question  Hello. Dr. Freddrick MarchWertwas inquiring about why a Dr. Nedra HaiLee put me on Mycophenolate(cellcept) [place]I reached out to the doctor and had my charts sent from Dr. Marigene EhlersLee's office to Dr. Sherene SiresWert. I also asked him to follow up on why I'm on cellcept and give feed back to Dr. Sherene SiresWert. Not sure if you received anything from Dr. Nedra HaiLee but my question to you is do we stay on the cellcept? This medicine is very pricey and only one refill left. I'm getting this medicine through a grant program as of now. Not sure how much this medicine will cost after last refill. Thanks in advance.  MW, please advise. Thanks!

## 2017-12-13 ENCOUNTER — Other Ambulatory Visit: Payer: Self-pay | Admitting: Adult Health

## 2017-12-15 ENCOUNTER — Other Ambulatory Visit: Payer: Self-pay | Admitting: Internal Medicine

## 2017-12-16 ENCOUNTER — Encounter (HOSPITAL_COMMUNITY): Payer: Self-pay | Admitting: *Deleted

## 2017-12-16 ENCOUNTER — Other Ambulatory Visit: Payer: Self-pay

## 2017-12-16 ENCOUNTER — Inpatient Hospital Stay (HOSPITAL_COMMUNITY)
Admission: EM | Admit: 2017-12-16 | Discharge: 2017-12-20 | DRG: 291 | Disposition: A | Discharge status: Home / Self Care | Payer: Medicare Other | Attending: Internal Medicine | Admitting: Internal Medicine

## 2017-12-16 ENCOUNTER — Emergency Department (HOSPITAL_COMMUNITY): Payer: Medicare Other

## 2017-12-16 DIAGNOSIS — J45909 Unspecified asthma, uncomplicated: Secondary | ICD-10-CM | POA: Diagnosis present

## 2017-12-16 DIAGNOSIS — K219 Gastro-esophageal reflux disease without esophagitis: Secondary | ICD-10-CM | POA: Diagnosis present

## 2017-12-16 DIAGNOSIS — Z86711 Personal history of pulmonary embolism: Secondary | ICD-10-CM | POA: Diagnosis present

## 2017-12-16 DIAGNOSIS — J9611 Chronic respiratory failure with hypoxia: Secondary | ICD-10-CM | POA: Diagnosis not present

## 2017-12-16 DIAGNOSIS — Z66 Do not resuscitate: Secondary | ICD-10-CM | POA: Diagnosis present

## 2017-12-16 DIAGNOSIS — J9612 Chronic respiratory failure with hypercapnia: Secondary | ICD-10-CM | POA: Diagnosis not present

## 2017-12-16 DIAGNOSIS — R0902 Hypoxemia: Secondary | ICD-10-CM | POA: Diagnosis not present

## 2017-12-16 DIAGNOSIS — F329 Major depressive disorder, single episode, unspecified: Secondary | ICD-10-CM | POA: Diagnosis present

## 2017-12-16 DIAGNOSIS — Z794 Long term (current) use of insulin: Secondary | ICD-10-CM

## 2017-12-16 DIAGNOSIS — I509 Heart failure, unspecified: Secondary | ICD-10-CM | POA: Diagnosis not present

## 2017-12-16 DIAGNOSIS — I10 Essential (primary) hypertension: Secondary | ICD-10-CM | POA: Diagnosis not present

## 2017-12-16 DIAGNOSIS — J8489 Other specified interstitial pulmonary diseases: Secondary | ICD-10-CM | POA: Diagnosis present

## 2017-12-16 DIAGNOSIS — E1151 Type 2 diabetes mellitus with diabetic peripheral angiopathy without gangrene: Secondary | ICD-10-CM | POA: Diagnosis present

## 2017-12-16 DIAGNOSIS — R300 Dysuria: Secondary | ICD-10-CM | POA: Diagnosis present

## 2017-12-16 DIAGNOSIS — J849 Interstitial pulmonary disease, unspecified: Secondary | ICD-10-CM | POA: Diagnosis present

## 2017-12-16 DIAGNOSIS — F418 Other specified anxiety disorders: Secondary | ICD-10-CM | POA: Diagnosis present

## 2017-12-16 DIAGNOSIS — J84112 Idiopathic pulmonary fibrosis: Secondary | ICD-10-CM | POA: Diagnosis not present

## 2017-12-16 DIAGNOSIS — Z880 Allergy status to penicillin: Secondary | ICD-10-CM

## 2017-12-16 DIAGNOSIS — E1149 Type 2 diabetes mellitus with other diabetic neurological complication: Secondary | ICD-10-CM | POA: Diagnosis present

## 2017-12-16 DIAGNOSIS — Z888 Allergy status to other drugs, medicaments and biological substances status: Secondary | ICD-10-CM

## 2017-12-16 DIAGNOSIS — E785 Hyperlipidemia, unspecified: Secondary | ICD-10-CM | POA: Diagnosis present

## 2017-12-16 DIAGNOSIS — Z9981 Dependence on supplemental oxygen: Secondary | ICD-10-CM

## 2017-12-16 DIAGNOSIS — Z79899 Other long term (current) drug therapy: Secondary | ICD-10-CM

## 2017-12-16 DIAGNOSIS — Z7952 Long term (current) use of systemic steroids: Secondary | ICD-10-CM

## 2017-12-16 DIAGNOSIS — J810 Acute pulmonary edema: Secondary | ICD-10-CM | POA: Diagnosis not present

## 2017-12-16 DIAGNOSIS — J42 Unspecified chronic bronchitis: Secondary | ICD-10-CM | POA: Diagnosis present

## 2017-12-16 DIAGNOSIS — I110 Hypertensive heart disease with heart failure: Secondary | ICD-10-CM | POA: Diagnosis not present

## 2017-12-16 DIAGNOSIS — Z955 Presence of coronary angioplasty implant and graft: Secondary | ICD-10-CM | POA: Diagnosis not present

## 2017-12-16 DIAGNOSIS — E874 Mixed disorder of acid-base balance: Secondary | ICD-10-CM | POA: Diagnosis present

## 2017-12-16 DIAGNOSIS — I5033 Acute on chronic diastolic (congestive) heart failure: Secondary | ICD-10-CM | POA: Diagnosis present

## 2017-12-16 DIAGNOSIS — J811 Chronic pulmonary edema: Secondary | ICD-10-CM | POA: Diagnosis present

## 2017-12-16 DIAGNOSIS — E1140 Type 2 diabetes mellitus with diabetic neuropathy, unspecified: Secondary | ICD-10-CM | POA: Diagnosis not present

## 2017-12-16 DIAGNOSIS — Z87891 Personal history of nicotine dependence: Secondary | ICD-10-CM | POA: Diagnosis not present

## 2017-12-16 DIAGNOSIS — J9621 Acute and chronic respiratory failure with hypoxia: Secondary | ICD-10-CM | POA: Diagnosis present

## 2017-12-16 DIAGNOSIS — Z7902 Long term (current) use of antithrombotics/antiplatelets: Secondary | ICD-10-CM

## 2017-12-16 DIAGNOSIS — E039 Hypothyroidism, unspecified: Secondary | ICD-10-CM | POA: Diagnosis present

## 2017-12-16 DIAGNOSIS — I2510 Atherosclerotic heart disease of native coronary artery without angina pectoris: Secondary | ICD-10-CM | POA: Diagnosis present

## 2017-12-16 DIAGNOSIS — Z90710 Acquired absence of both cervix and uterus: Secondary | ICD-10-CM

## 2017-12-16 DIAGNOSIS — E1165 Type 2 diabetes mellitus with hyperglycemia: Secondary | ICD-10-CM | POA: Diagnosis present

## 2017-12-16 DIAGNOSIS — J9622 Acute and chronic respiratory failure with hypercapnia: Secondary | ICD-10-CM | POA: Diagnosis present

## 2017-12-16 DIAGNOSIS — M79601 Pain in right arm: Secondary | ICD-10-CM | POA: Diagnosis present

## 2017-12-16 DIAGNOSIS — Z86718 Personal history of other venous thrombosis and embolism: Secondary | ICD-10-CM | POA: Diagnosis not present

## 2017-12-16 DIAGNOSIS — Z885 Allergy status to narcotic agent status: Secondary | ICD-10-CM

## 2017-12-16 HISTORY — DX: Atherosclerotic heart disease of native coronary artery without angina pectoris: I25.10

## 2017-12-16 HISTORY — DX: Migraine, unspecified, not intractable, without status migrainosus: G43.909

## 2017-12-16 HISTORY — DX: Acute embolism and thrombosis of unspecified deep veins of unspecified lower extremity: I82.409

## 2017-12-16 LAB — I-STAT TROPONIN, ED: Troponin i, poc: 0 ng/mL (ref 0.00–0.08)

## 2017-12-16 LAB — CBC
HCT: 45.5 % (ref 36.0–46.0)
Hemoglobin: 13.8 g/dL (ref 12.0–15.0)
MCH: 28.2 pg (ref 26.0–34.0)
MCHC: 30.3 g/dL (ref 30.0–36.0)
MCV: 92.9 fL (ref 78.0–100.0)
Platelets: 270 10*3/uL (ref 150–400)
RBC: 4.9 MIL/uL (ref 3.87–5.11)
RDW: 14.6 % (ref 11.5–15.5)
WBC: 14.1 10*3/uL — ABNORMAL HIGH (ref 4.0–10.5)

## 2017-12-16 LAB — BRAIN NATRIURETIC PEPTIDE: B Natriuretic Peptide: 170.3 pg/mL — ABNORMAL HIGH (ref 0.0–100.0)

## 2017-12-16 LAB — BASIC METABOLIC PANEL
ANION GAP: 14 (ref 5–15)
BUN: 26 mg/dL — ABNORMAL HIGH (ref 6–20)
CALCIUM: 9.6 mg/dL (ref 8.9–10.3)
CO2: 33 mmol/L — ABNORMAL HIGH (ref 22–32)
Chloride: 88 mmol/L — ABNORMAL LOW (ref 101–111)
Creatinine, Ser: 1.07 mg/dL — ABNORMAL HIGH (ref 0.44–1.00)
GFR calc Af Amer: >60 mL/min (ref >60)
GFR, EST NON AFRICAN AMERICAN: 53 mL/min — AB (ref >60)
GLUCOSE: 525 mg/dL — AB (ref 65–99)
Potassium: 4.9 mmol/L (ref 3.5–5.1)
SODIUM: 135 mmol/L (ref 135–145)

## 2017-12-16 LAB — INFLUENZA PANEL BY PCR (TYPE A & B)
INFLAPCR: NEGATIVE
Influenza B By PCR: NEGATIVE

## 2017-12-16 LAB — CBG MONITORING, ED: Glucose-Capillary: 192 mg/dL — ABNORMAL HIGH (ref 65–99)

## 2017-12-16 MED ORDER — ROSUVASTATIN CALCIUM 10 MG PO TABS
20.0000 mg | ORAL_TABLET | Freq: Every day | ORAL | Status: DC
  Administered 2017-12-17 – 2017-12-20 (×4): 20 mg via ORAL
  Filled 2017-12-16: qty 2
  Filled 2017-12-16: qty 1
  Filled 2017-12-16 (×4): qty 2

## 2017-12-16 MED ORDER — PANTOPRAZOLE SODIUM 40 MG PO TBEC
40.0000 mg | DELAYED_RELEASE_TABLET | Freq: Two times a day (BID) | ORAL | Status: DC
  Administered 2017-12-17 (×2): 40 mg via ORAL
  Filled 2017-12-16 (×2): qty 1

## 2017-12-16 MED ORDER — INSULIN ASPART 100 UNIT/ML ~~LOC~~ SOLN
0.0000 [IU] | Freq: Three times a day (TID) | SUBCUTANEOUS | Status: DC
  Administered 2017-12-17: 8 [IU] via SUBCUTANEOUS
  Administered 2017-12-17 – 2017-12-18 (×2): 15 [IU] via SUBCUTANEOUS
  Administered 2017-12-18: 8 [IU] via SUBCUTANEOUS
  Administered 2017-12-18: 3 [IU] via SUBCUTANEOUS
  Administered 2017-12-19: 15 [IU] via SUBCUTANEOUS
  Administered 2017-12-20: 5 [IU] via SUBCUTANEOUS

## 2017-12-16 MED ORDER — INSULIN ASPART 100 UNIT/ML ~~LOC~~ SOLN
14.0000 [IU] | Freq: Once | SUBCUTANEOUS | Status: DC
  Filled 2017-12-16: qty 1

## 2017-12-16 MED ORDER — POTASSIUM CHLORIDE CRYS ER 20 MEQ PO TBCR
20.0000 meq | EXTENDED_RELEASE_TABLET | Freq: Every day | ORAL | Status: DC
  Administered 2017-12-17 – 2017-12-19 (×3): 20 meq via ORAL
  Filled 2017-12-16 (×3): qty 1

## 2017-12-16 MED ORDER — INSULIN DETEMIR 100 UNIT/ML FLEXPEN: 15.0000 [IU] | PEN_INJECTOR | SUBCUTANEOUS | Status: DC

## 2017-12-16 MED ORDER — INSULIN DETEMIR 100 UNIT/ML ~~LOC~~ SOLN
25.0000 [IU] | Freq: Every day | SUBCUTANEOUS | Status: DC
  Administered 2017-12-17: 25 [IU] via SUBCUTANEOUS
  Filled 2017-12-16 (×3): qty 0.25

## 2017-12-16 MED ORDER — INSULIN DETEMIR 100 UNIT/ML ~~LOC~~ SOLN
15.0000 [IU] | Freq: Every day | SUBCUTANEOUS | Status: DC
  Filled 2017-12-16: qty 0.15

## 2017-12-16 MED ORDER — LORAZEPAM 2 MG/ML IJ SOLN
0.5000 mg | Freq: Once | INTRAMUSCULAR | Status: AC
  Administered 2017-12-16: 0.5 mg via INTRAVENOUS
  Filled 2017-12-16: qty 1

## 2017-12-16 MED ORDER — FUROSEMIDE 10 MG/ML IJ SOLN
40.0000 mg | Freq: Two times a day (BID) | INTRAMUSCULAR | Status: DC
  Administered 2017-12-16 – 2017-12-17 (×2): 40 mg via INTRAVENOUS
  Filled 2017-12-16 (×2): qty 4

## 2017-12-16 MED ORDER — CLOPIDOGREL BISULFATE 75 MG PO TABS
75.0000 mg | ORAL_TABLET | Freq: Every day | ORAL | Status: DC
  Administered 2017-12-17 – 2017-12-20 (×4): 75 mg via ORAL
  Filled 2017-12-16 (×5): qty 1

## 2017-12-16 MED ORDER — POLYETHYLENE GLYCOL 3350 17 G PO PACK: 17.0000 g | PACK | Freq: Every day | ORAL | Status: DC | PRN

## 2017-12-16 MED ORDER — ENOXAPARIN SODIUM 40 MG/0.4ML ~~LOC~~ SOLN
40.0000 mg | Freq: Every day | SUBCUTANEOUS | Status: DC
  Administered 2017-12-17 – 2017-12-20 (×4): 40 mg via SUBCUTANEOUS
  Filled 2017-12-16 (×6): qty 0.4

## 2017-12-16 MED ORDER — PREDNISONE 20 MG PO TABS
20.0000 mg | ORAL_TABLET | Freq: Every day | ORAL | Status: DC
  Administered 2017-12-17 – 2017-12-20 (×4): 20 mg via ORAL
  Filled 2017-12-16 (×4): qty 1

## 2017-12-16 MED ORDER — IPRATROPIUM-ALBUTEROL 0.5-2.5 (3) MG/3ML IN SOLN
3.0000 mL | Freq: Three times a day (TID) | RESPIRATORY_TRACT | Status: DC
  Administered 2017-12-17: 3 mL via RESPIRATORY_TRACT
  Filled 2017-12-16: qty 3

## 2017-12-16 MED ORDER — TRAMADOL HCL 50 MG PO TABS: 50.0000 mg | ORAL_TABLET | Freq: Four times a day (QID) | ORAL | Status: DC | PRN

## 2017-12-16 MED ORDER — SERTRALINE HCL 25 MG PO TABS
25.0000 mg | ORAL_TABLET | Freq: Every day | ORAL | Status: DC
  Administered 2017-12-17 – 2017-12-20 (×4): 25 mg via ORAL
  Filled 2017-12-16 (×5): qty 1

## 2017-12-16 MED ORDER — PREGABALIN 100 MG PO CAPS
100.0000 mg | ORAL_CAPSULE | Freq: Two times a day (BID) | ORAL | Status: DC
  Administered 2017-12-17 – 2017-12-19 (×6): 100 mg via ORAL
  Filled 2017-12-16 (×2): qty 1
  Filled 2017-12-16: qty 2
  Filled 2017-12-16 (×4): qty 1

## 2017-12-16 MED ORDER — GUAIFENESIN-DM 100-10 MG/5ML PO SYRP
10.0000 mL | ORAL_SOLUTION | Freq: Every day | ORAL | Status: DC
  Administered 2017-12-17 (×2): 10 mL via ORAL
  Filled 2017-12-16 (×2): qty 10

## 2017-12-16 NOTE — ED Notes (Signed)
ED Provider at bedside. 

## 2017-12-16 NOTE — ED Provider Notes (Signed)
Emergency Department Provider Note   I have reviewed the triage vital signs and the nursing notes.   HISTORY  Chief Complaint Shortness of Breath   HPI Peggy Todd is a 67 y.o. female with PMH of PE, DM, HLD, HTN, DM, and asthma presents to the emergency department for evaluation of progressively worsening shortness of breath over the last 36 hours with increased home oxygen requirement.  Patient denies any fevers or shaking chills.  She had some sensation of tightness in her throat which is continued through the day along with right arm discomfort.  She denies any chest pain.  She is been compliant with her home Lasix and trying nebulizers at home with no significant improvement in symptoms.  She also notes a mild/moderate headache which is typical of past headaches that she has had.  She no longer weighs herself daily.  She was diagnosed with a pulmonary embolism in 2015.  He was anticoagulated for 1 year and then taken off anticoagulation by her PCP.  Patient also with history of ACS but states the right arm discomfort is atypical for any pain she has had in the past related to this event.  Past Medical History:  Diagnosis Date  . Asthma   . Depression   . Diabetic peripheral angiopathy (HCC)   . Dvt femoral (deep venous thrombosis) (HCC) ?2015   LLE  . GERD (gastroesophageal reflux disease)   . High cholesterol   . History of stomach ulcers   . Hypertension   . On home oxygen therapy "   "4-5L; high w/activity" 07/09/2017)  . Pneumonia 2017  . Pulmonary embolism (HCC) ?2015  . Type II diabetes mellitus [facility](HCC)     Patient Active Problem List   Diagnosis Date Noted  . Chronic respiratory failure with hypoxia and hypercapnia (HCC) 08/18/2017  . E. coli UTI (urinary tract infection) 07/10/2017  . Enterococcus UTI 07/10/2017  . Hypokalemia 07/08/2017  . Hyperglycemia   . Upper airway cough syndrome 06/28/2017  . DOE (dyspnea on exertion) 06/27/2017  . Chronic respiratory  failure with hypoxia (HCC) 06/27/2017  . Degeneration of lumbar intervertebral disc 05/06/2017  . CAD (coronary artery disease) 12/12/2016  . ILD (interstitial lung disease) (HCC) 11/06/2016  . Hypoxemia 11/06/2016  . 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 Acute respiratory failure with hypoxia (HCC) 12/26/2015  . Opioid overdose (HCC) 12/26/2015  . Acute hypoxemic respiratory failure (HCC) 10/20/2015  . Anxiety and depression 10/20/2015  . Gastroesophageal reflux disease without esophagitis 10/20/2015  . Iron deficiency anemia 10/20/2015  . Pulmonary edema 10/20/2015  . Herniated nucleus pulposus, L4-5 left 08/08/2015  . Chronic anticoagulation 07/26/2015  . Dyslipidemia 07/26/2015  . Leg DVT (deep venous thromboembolism), chronic, left (HCC) 03/03/2013  . History of pulmonary embolism 02/28/2013  . Spinal stenosis of lumbar region without neurogenic claudication 08/25/2012  . Acquired spondylolisthesis 07/22/2012  . Idiopathic peripheral neuropathy 07/09/2012  . Essential hypertension 06/06/2011  . Hypothyroidism 06/06/2011  . Type 2 diabetes mellitus with neurologic complication, with Charell Faulk-term current use of insulin (HCC) 06/06/2011    Past Surgical History:  Procedure Laterality Date  . APPENDECTOMY    . BACK SURGERY    . CORONARY ANGIOPLASTY WITH STENT PLACEMENT  12/2016   "4 stents"  . ESOPHAGOGASTRODUODENOSCOPY (EGD) WITH ESOPHAGEAL DILATION  X 2  . LAPAROSCOPIC CHOLECYSTECTOMY    . LUMBAR DISC SURGERY    . TOTAL ABDOMINAL HYSTERECTOMY    . TUBAL LIGATION      Current Outpatient Rx  .  Order #: [id number] Class: Historical Med  . Order #: [id number] Class: Normal  . Order #: [id number] Class: Historical Med  . Order #: [id number] Class: Normal  . Order #: [id number] Class: Normal  . Order #: [id number] Class: Normal  . Order #: [id number] Class: Normal  . Order #: [id number] Class: Historical Med  . Order #: [id number] Class: Historical Med  . Order #:  [id number] Class: Normal  . Order #: [id number] Class: Historical Med  . Order #: [id number] Class: Normal  . Order #: [id number] Class: Print  . Order #: [id number] Class: Historical Med  . Order #: [id number] Class: Historical Med  . Order #: [id number] Class: Historical Med  . Order #: [id number] Class: Phone In  . Order #: [id number] Class: Historical Med  . Order #: [id number] Class: Historical Med  . Order #: [id number] Class: Normal    Allergies Oxycodone; Penicillins; Codeine; Hydrochlorothiazide; and Atorvastatin  Family History  Problem Relation Age of Onset  . Heart disease Mother   . Stroke Mother   . Heart disease Father     Social History Social History   Tobacco Use  . Smoking status: Former Smoker    Years: 10.00    Types: Cigarettes    Last attempt to quit: 1990    Years since quitting: 29.2  . Smokeless tobacco: Never Used  Substance Use Topics  . Alcohol use: No  . Drug use: No    Review of Systems  Constitutional: No fever/chills Eyes: No visual changes. ENT: No sore throat. Cardiovascular: Denies chest pain. Respiratory: Positive shortness of breath. Gastrointestinal: No abdominal pain.  No nausea, no vomiting.  No diarrhea.  No constipation. Genitourinary: Negative for dysuria. Musculoskeletal: Negative for back pain. Positive right arm pain.  Skin: Negative for rash. Neurological: Negative for focal weakness or numbness. Positive HA.   10-point ROS otherwise negative.  ____________________________________________   PHYSICAL EXAM:  VITAL SIGNS: ED Triage Vitals [12/16/17 1431]  Enc Vitals Group     BP 136/80     Pulse Rate (!) 125     Resp 18     Temp 97.6 F (36.4 C)     Temp Source Oral     SpO2 (!) 85 %   Constitutional: Alert and oriented. Increased WOB.  Eyes: Conjunctivae are normal. Head: Atraumatic. Nose: No congestion/rhinnorhea. Mouth/Throat: Mucous membranes are moist.  Oropharynx non-erythematous. Neck: No stridor.     Cardiovascular: Tachycardia. Good peripheral circulation. Grossly normal heart sounds.   Respiratory: Increased respiratory effort but able to provide history and speaking in complete sentences.  No retractions. Lungs with crackles at the bases bilaterally.  Gastrointestinal: Soft and nontender. No distention.  Musculoskeletal: No lower extremity tenderness nor edema. No gross deformities of extremities. Neurologic:  Normal speech and language. No gross focal neurologic deficits are appreciated.  Skin:  Skin is warm, dry and intact. No rash noted.  ____________________________________________   LABS (all labs ordered are listed, but only abnormal results are displayed)  Labs Reviewed  CBC - Abnormal; Notable for the following components:      Result Value   WBC 14.1 (*)    All other components within normal limits  BRAIN NATRIURETIC PEPTIDE - Abnormal; Notable for the following components:   B Natriuretic Peptide 170.3 (*)    All other components within normal limits  BASIC METABOLIC PANEL - Abnormal; Notable for the following components:   Chloride 88 (*)    CO2 33 (*)    Glucose, Bld 525 (*)    BUN 26 (*)    Creatinine, Ser 1.07 (*)    GFR  calc non Af Amer 53 (*)    All other components within normal limits  BASIC METABOLIC PANEL - Abnormal; Notable for the following components:   Chloride 91 (*)    CO2 36 (*)    Glucose, Bld 101 (*)    All other components within normal limits  CBC - Abnormal; Notable for the following components:   WBC 12.8 (*)    All other components within normal limits  CBG MONITORING, ED - Abnormal; Notable for the following components:   Glucose-Capillary 192 (*)    All other components within normal limits  CBG MONITORING, ED - Abnormal; Notable for the following components:   Glucose-Capillary 56 (*)    All other components within normal limits  CBG MONITORING, ED - Abnormal; Notable for the following components:   Glucose-Capillary 252 (*)     All other components within normal limits  INFLUENZA PANEL BY PCR (TYPE A & B)  TROPONIN I  TROPONIN I  URINALYSIS, ROUTINE W REFLEX MICROSCOPIC  I-STAT TROPONIN, ED   ____________________________________________  EKG   EKG Interpretation  Date/Time:  Tuesday December 16 2017 14:45:33 EDT Ventricular Rate:  119 PR Interval:    QRS Duration: 83 QT Interval:  293 QTC Calculation: 413 R Axis:   103 Text Interpretation:  Sinus tachycardia Biatrial enlargement Probable anteroseptal infarct, recent Lateral leads are also involved No STEMI.  Confirmed by Alona Bene [phone]) on 12/16/2017 3:11:37 PM       ____________________________________________  RADIOLOGY  Dg Chest Portable 1 View  Result Date: 12/16/2017 CLINICAL DATA:  Shortness of breath. EXAM: PORTABLE CHEST 1 VIEW COMPARISON:  Two-view chest x-ray 10/06/2017 FINDINGS: The heart is enlarged. Diffuse interstitial edema is again seen. Bilateral effusions are suspected. No significant airspace consolidation is present. The visualized soft tissues and bony thorax are unremarkable. IMPRESSION: 1. Cardiomegaly with diffuse interstitial edema and bilateral effusions likely reflecting congestive heart failure. Electronically Signed   By: Marin Roberts M.D.   On: 12/16/2017 14:57    ____________________________________________   PROCEDURES  Procedure(s) performed:   .Critical Care Performed by: Maia Plan, MD Authorized by: Maia Plan, MD   Critical care provider statement:    Critical care time (minutes):  35   Critical care time was exclusive of:  Separately billable procedures and treating other patients and teaching time   Critical care was necessary to treat or prevent imminent or life-threatening deterioration of the following conditions:  Respiratory failure   Critical care was time spent personally by me on the following activities:  Blood draw for specimens, development of treatment plan with patient or  surrogate, evaluation of patient's response to treatment, examination of patient, obtaining history from patient or surrogate, ordering and performing treatments and interventions, ordering and review of laboratory studies, ordering and review of radiographic studies, pulse oximetry, re-evaluation of patient's condition and review of old charts   I assumed direction of critical care for this patient from another provider in my specialty: no     ____________________________________________   INITIAL IMPRESSION / ASSESSMENT AND PLAN / ED COURSE  Pertinent labs & imaging results that were available during my care of the patient were reviewed by me and considered in my medical decision making (see chart for details).  Patient presents to the emergency department for evaluation of increasing shortness of breath with pain in the right arm and associated headache.  Low suspicion for infectious etiology, ACS, PE.  Reviewed EKG with no acute changes from prior other  than tachycardia.  Her tachycardia has improved since triage with adequate oxygenation.  Patient is currently on nonrebreather mask at 10 L/min.  I plan to transition her to high flow nasal cannula and diuresis with Lasix.  No wheezing on exam.  Labs including BNP and troponin are pending.   03:50 PM Patient comfortable on 6L HFNC. Continuing to follow labs.   Labs reviewed. BNP slightly elevated. Lasix given and patient continued on high flow O2. No BiPAP required at this time. Flu negative.   Discussed patient's case with Hospitalist to request admission. Patient and family (if present) updated with plan. Care transferred to Hospitalist service.  I reviewed all nursing notes, vitals, pertinent old records, EKGs, labs, imaging (as available).  ____________________________________________  FINAL CLINICAL IMPRESSION(S) / ED DIAGNOSES  Final diagnoses:  Hypoxia  Acute on chronic congestive heart failure, unspecified heart failure type  (HCC)     MEDICATIONS GIVEN DURING THIS VISIT:  Medications  clopidogrel (PLAVIX) tablet 75 mg (75 mg Oral Given 12/17/17 0926)  guaiFENesin-dextromethorphan (ROBITUSSIN DM) 100-10 MG/5ML syrup 10 mL (10 mLs Oral Given 12/17/17 0036)  potassium chloride SA (K-DUR,KLOR-CON) CR tablet 20 mEq (20 mEq Oral Given 12/17/17 0926)  pantoprazole (PROTONIX) EC tablet 40 mg (40 mg Oral Given 12/17/17 0926)  polyethylene glycol (MIRALAX / GLYCOLAX) packet 17 g (not administered)  predniSONE (DELTASONE) tablet 20 mg (20 mg Oral Given 12/17/17 0825)  pregabalin (LYRICA) capsule 100 mg (100 mg Oral Given 12/17/17 0036)  rosuvastatin (CRESTOR) tablet 20 mg (not administered)  sertraline (ZOLOFT) tablet 25 mg (not administered)  traMADol (ULTRAM) tablet 50 mg (not administered)  enoxaparin (LOVENOX) injection 40 mg (not administered)  insulin aspart (novoLOG) injection 0-15 Units (0 Units Subcutaneous Not Given 12/17/17 0824)  insulin aspart (novoLOG) injection 14 Units (14 Units Subcutaneous Not Given 12/16/17 2320)  insulin detemir (LEVEMIR) injection 25 Units (25 Units Subcutaneous Given 12/17/17 0128)  ipratropium-albuterol (DUONEB) 0.5-2.5 (3) MG/3ML nebulizer solution 3 mL (3 mLs Nebulization Given 12/17/17 0825)  sodium chloride (OCEAN) 0.65 % nasal spray 1 spray (not administered)  mycophenolate (CELLCEPT) tablet 500 mg (not administered)  LORazepam (ATIVAN) injection 0.5 mg (0.5 mg Intravenous Given 12/16/17 1935)    Note:  This document was prepared using Dragon voice recognition software and may include unintentional dictation errors.  Alona Bene, MD Emergency Medicine    Elizah Lydon, Arlyss Repress, MD 12/17/17 [phone]

## 2017-12-16 NOTE — H&P (Signed)
History and Physical    Peggy Todd BJY:[id number] DOB: 1951-06-29 DOA: 12/16/2017  PCP: Emi Belfast, FNP   Patient coming from: Home   Chief Complaint: SOB  HPI: Peggy Todd is a 67 y.o. female with medical history significant for ILD-pulmonary fibrosis, history of PE and DVT, hypertension, hypothyroidism, DM 2, CAD.  Patient presented to the ED with complaints of worsening shortness of breath over the past 36 hours, increasing O2 requirement. Patient has chronic shortness of breath.  Patient is on 5 L nasal cannula at baseline.  She reports a chronic unchanged cough productive of white mucoid occasionally greenish sputum. Patient also reports tightness in her throat and radiates right arm discomfort, also of 2 days duration. She denies chest pain.  Patient denies sore throat body aches fevers or chills.  She denies swelling in her legs.  Since reports compliance with her Lasix 60mg  daily, reports compliance with low-salt diet.  Patient is on chronic prednisone.  Patient was diagnosed with PE and DVT at the same time in 2015, she was on anticoagulation for a year.  Patient reports PE/DVT with provoked by hormonal medications she was taking at the time for menopause, this was discontinued.  ED Course: The ED provider O2 sats as low as 70 in the ED, initially requiring nonrebreather, later went down to HFNC.  Also initial tachycardia that improved with better O2 sats,.  WBC 14.1, otherwise unremarkable CBC and BMP.  I-STAT troponin negative.  Influenza negative. Patient reports feeling better since being in the ED. BNP 170,  Portable chest x-ray shows cardiomegaly with diffuse interstitial edema and bilateral effusions likely reflecting CHF.  Shunt was given Ativan 0.5 mg in the ED hospitalist was called to admit for pulmonary edema.  Review of Systems: Patient reports dysuria of 3 days duration, denies fever or chills otherwise as per HPI otherwise 10 point review of systems negative.    Past Medical History:  Diagnosis Date  . Asthma   . Depression   . Diabetic peripheral angiopathy (HCC)   . Dvt femoral (deep venous thrombosis) (HCC) ?2015   LLE  . GERD (gastroesophageal reflux disease)   . High cholesterol   . History of stomach ulcers   . Hypertension   . On home oxygen therapy "   "4-5L; high w/activity" 07/09/2017)  . Pneumonia 2017  . Pulmonary embolism (HCC) ?2015  . Type II diabetes mellitus (HCC)     Past Surgical History:  Procedure Laterality Date  . APPENDECTOMY    . BACK SURGERY    . CORONARY ANGIOPLASTY WITH STENT PLACEMENT  12/2016   "4 stents"  . ESOPHAGOGASTRODUODENOSCOPY (EGD) WITH ESOPHAGEAL DILATION  X 2  . LAPAROSCOPIC CHOLECYSTECTOMY    . LUMBAR DISC SURGERY    . TOTAL ABDOMINAL HYSTERECTOMY    . TUBAL LIGATION       reports that she quit smoking about 29 years ago. Her smoking use included cigarettes. She quit after 10.00 years of use. she has never used smokeless tobacco. She reports that she does not drink alcohol or use drugs.  Allergies  Allergen Reactions  . Oxycodone Anxiety and Other (See Comments)    Patient states she cannot take/ makes her very confused!  . Penicillins Swelling    Has patient had a PCN reaction causing immediate rash, facial/tongue/throat swelling, SOB or lightheadedness with hypotension: Yes Has patient had a PCN reaction causing severe rash involving mucus membranes or skin necrosis: No Has patient had a PCN reaction that required  hospitalization: No Has patient had a PCN reaction occurring within the last 10 years: No If all of the above answers are "NO", then may proceed with Cephalosporin use.   . Codeine Other (See Comments) and Anxiety    INSOMNIA, m "makes me hyper"  . Hydrochlorothiazide Other (See Comments)    Abdominal pain  . Atorvastatin Other (See Comments)    Generalized myalgia    Family History  Problem Relation Age of Onset  . Heart disease Mother   . Stroke Mother   .  Heart disease Father     Prior to Admission medications   Medication Sig Start Date End Date Taking? Authorizing Provider  carboxymethylcellulose (REFRESH PLUS) 0.5 % SOLN Place 1 drop into both eyes 3 (three) times daily as needed (dry eyes).   Yes [provider]  clopidogrel (PLAVIX) 75 MG tablet Take 1 tablet (75 mg total) daily by mouth. 08/18/17  Yes Emi Belfast, FNP  Dextromethorphan-Guaifenesin (DELSYM [facility]/CHEST CONG DM CHILD) 5-100 MG/5ML LIQD Take 10 mLs by mouth at bedtime.   Yes [provider]  furosemide (LASIX) 20 MG tablet Take 3 tablets (60 mg total) by mouth daily. 12/08/17  Yes Emi Belfast, FNP  insulin aspart (NOVOLOG) 100 UNIT/ML injection Inject 4 Units into the skin 3 (three) times daily with meals. 07/12/17  Yes Sheikh, Omair Latif, DO  Insulin Detemir (LEVEMIR) 100 UNIT/ML Pen Inject 15 units in AM and 25 units in PM Patient taking differently: Inject 15-25 Units into the skin See admin instructions. Inject 15 units in AM and 25 units in PM 08/18/17  Yes Olean Ree B, FNP  KLOR-CON M20 20 MEQ tablet TAKE 1 TABLET BY MOUTH EVERY DAY 12/16/17  Yes Parrett, [name] S, NP  nitroGLYCERIN (NITROSTAT) 0.4 MG SL tablet Place 0.4 mg under the tongue every 5 (five) minutes as needed for chest pain.   Yes [provider]  OXYGEN Place 6-10 L into the nose continuous. 6lpm with rest and 8-10 lpm with exertion   Yes [provider]  pantoprazole (PROTONIX) 40 MG tablet TAKE 30- 60 MIN BEFORE YOUR FIRST AND LAST MEALS OF THE DAY Patient taking differently: TAKE 40mg  by mouth twice daily 12/15/17  Yes Nyoka Cowden, MD  polyethylene glycol (MIRALAX / GLYCOLAX) packet Take 17 g by mouth daily as needed for mild constipation.    Yes [provider]  predniSONE (DELTASONE) 10 MG tablet TAKE TWO TABLETS BY MOUTH DAILY WITH BREAKFAST. Patient taking differently: TAKE TWO TABLETS (20mg ) BY MOUTH DAILY WITH BREAKFAST. 12/16/17  Yes  Parrett, [name] S, NP  pregabalin (LYRICA) 100 MG capsule Take 1 capsule (100 mg total) by mouth 2 (two) times daily. 10/20/17  Yes Parrett, [name] S, NP  rosuvastatin (CRESTOR) 20 MG tablet Take 20 mg by mouth daily.   Yes [provider]  sertraline (ZOLOFT) 25 MG tablet Take 25 mg by mouth daily.   Yes [provider]  sodium chloride (OCEAN) 0.65 % SOLN nasal spray Place 1 spray into both nostrils as needed for congestion.   Yes [provider]  traMADol (ULTRAM) 50 MG tablet TAKE 1 TABLET BY MOUTH EVERY 6 HOURS AS NEEDED FOR PAIN /COUGH Patient taking differently: Take 50-100 mg by mouth See admin instructions. Take 1 tablet (50mg ) by mouth twice daily, and 2 tablet (100mg ) at bedtime 12/08/17  Yes Nyoka Cowden, MD  acetaminophen (TYLENOL) 500 MG tablet Take 1,000 mg by mouth every 6 (six) hours as needed.  [provider]  albuterol (PROAIR HFA) 108 (90 Base) MCG/ACT inhaler Inhale 2 puffs into the lungs 4 (four) times daily as needed. 10/30/13   [provider]  mycophenolate (CELLCEPT) 500 MG tablet Take 1 tablet (500 mg total) by mouth 2 (two) times daily. Patient not taking: Reported on 12/16/2017 09/10/17   Nyoka Cowden, MD    Physical Exam: Vitals:   12/16/17 1845 12/16/17 1900 12/16/17 1915 12/16/17 1930  BP: 131/82 (!) 151/87 128/84 119/86  Pulse: 87 95 86 82  Resp: 17 (!) 24 (!) 25 16  Temp:      TempSrc:      SpO2: 100% 100% 100% 100%    Constitutional: NAD, calm, comfortable Vitals:   12/16/17 1845 12/16/17 1900 12/16/17 1915 12/16/17 1930  BP: 131/82 (!) 151/87 128/84 119/86  Pulse: 87 95 86 82  Resp: 17 (!) 24 (!) 25 16  Temp:      TempSrc:      SpO2: 100% 100% 100% 100%   Eyes: PERRL, lids and conjunctivae normal ENMT: Mucous membranes are moist. Posterior pharynx clear of any exudate or lesions.Normal dentition.  Neck: normal, supple, no masses, no thyromegaly Respiratory: Diffuse fine crackles, no wheezing,  Normal respiratory effort. No accessory muscle use.  Cardiovascular: Regular rate and rhythm, no murmurs / rubs / gallops. No extremity edema. 2+ pedal pulses.  Abdomen: no tenderness, no masses palpated. No hepatosplenomegaly. Bowel sounds positive.  Musculoskeletal: no clubbing / cyanosis. No joint deformity upper and lower extremities. Good ROM, no contractures. Normal muscle tone.  Skin: no rashes, lesions, ulcers. No induration Neurologic: CN 2-12 grossly intact. Sensation intact, DTR normal. Strength 5/5 in all 4.  Psychiatric: Normal judgment and insight. Alert and oriented x 3. Normal mood.   Labs on Admission: I have personally reviewed following labs and imaging studies  CBC: Recent Labs  Lab 12/16/17 1449  WBC 14.1*  HGB 13.8  HCT 45.5  MCV 92.9  PLT 270   Basic Metabolic Panel: Recent Labs  Lab 12/16/17 1737  NA 135  K 4.9  CL 88*  CO2 33*  GLUCOSE 525*  BUN 26*  CREATININE 1.07*  CALCIUM 9.6   BNP (last 3 results) Recent Labs    10/06/17 0935  PROBNP 207.0*   Urine analysis:    Component Value Date/Time   COLORURINE YELLOW 07/09/2017 0705   APPEARANCEUR CLEAR 07/09/2017 0705   LABSPEC 1.013 07/09/2017 0705   PHURINE 6.0 07/09/2017 0705   GLUCOSEU >=500 (A) 07/09/2017 0705   HGBUR NEGATIVE 07/09/2017 0705   BILIRUBINUR NEGATIVE 07/09/2017 0705   KETONESUR 20 (A) 07/09/2017 0705   PROTEINUR 30 (A) 07/09/2017 0705   NITRITE NEGATIVE 07/09/2017 0705   LEUKOCYTESUR NEGATIVE 07/09/2017 0705    Radiological Exams on Admission: Dg Chest Portable 1 View  Result Date: 12/16/2017 CLINICAL DATA:  Shortness of breath. EXAM: PORTABLE CHEST 1 VIEW COMPARISON:  Two-view chest x-ray 10/06/2017 FINDINGS: The heart is enlarged. Diffuse interstitial edema is again seen. Bilateral effusions are suspected. No significant airspace consolidation is present. The visualized soft tissues and bony thorax are unremarkable. IMPRESSION: 1. Cardiomegaly with diffuse  interstitial edema and bilateral effusions likely reflecting congestive heart failure. Electronically Signed   By: Marin Roberts M.D.   On: 12/16/2017 14:57    EKG: Independently reviewed.  Sinus tachycardia- 119, enlarged P waves.  QTC 413.  No significant change from prior.  Assessment/Plan Active Problems:   ILD (interstitial lung disease) (HCC)   History of pulmonary  embolism   Essential hypertension   Hypothyroidism   Pulmonary edema   Type 2 diabetes mellitus with neurologic complication, with long-term current use of insulin (HCC)   CAD (coronary artery disease)   Chronic respiratory failure with hypoxia and hypercapnia (HCC)    Acute on chronic respiratory failure-with chronic unchanged mildly productive cough.  With increasing O2 requirements, 5l at baseline, now on HFNC.  Likely from pulmonary edema, suggested by chest x-ray.  No significant peripheral edema, patient not checking weights. last echo 10/2016-EF 65-70%, G1DD.   BNP- 170, not impressively elevated no prior to compare. Possible component of pulmonary fibrosis.  Reports compliance with Lasix 60 daily and low-salt diet. -Continue home prednisone 20 mg daily -will start diuresis with Lasix 40mg  bid. -Strict input output, daily weights -Continue home K supplementation - BMP a.m -Echocardiogram -Trend troponin  Interstitial lung disease-now [facility] She[facility] 5l [place] at baseline. For now is not on mycophenolate. - Cont home prednisone for now, we will hold off on increasing steroid dose unless no improvement with diuresis, considering patient is diabetic with blood sugars currently 525.  Right arm pain, throat tightness- 2 days.  EKG sinus tachycardia otherwise no significant change from prior.  Troponin negative -Trend troponin X3. - Echo  DM- elevated blood sugars- 525.  No anion gap.  Bicarb elevated at 33, metabolic alkalosis likely 2/2 compensation for CO2  retention. -Continue home Levemir - SS- moderate  Dysuria - 3 days.  Leukocytosis of 14-possibly UTI, but patient on chronic steroids. - UA   DVT prophylaxis: Lovenox Code Status: DNR-confirmed with patient at bedside Family Communication:  Disposition Plan: 2-3 days Consults called: None  Admission status: Inpt, tele   Onnie BoerEjiroghene E Emokpae MD [facility] Pager [phone] From 6PM-2AM.  Otherwise please contact night-coverage [email or web address] Password [email or web address]  12/16/2017, 8:02 PM

## 2017-12-16 NOTE — ED Notes (Signed)
Phlebotomy at bedside to recollect

## 2017-12-16 NOTE — Progress Notes (Signed)
Pt placed on [place] HFNC @6L , tolerating well.

## 2017-12-16 NOTE — ED Notes (Signed)
Per lab, Bmet hemolyzed. Will recollect sample.

## 2017-12-16 NOTE — ED Notes (Signed)
Family at bedside. 

## 2017-12-16 NOTE — ED Notes (Signed)
Patient is resting comfortably. Respirations even/unlabored. 

## 2017-12-16 NOTE — ED Notes (Signed)
Informed [place] - RN of pt's O2.

## 2017-12-16 NOTE — ED Notes (Signed)
RT aware of need for hiflow cannula

## 2017-12-16 NOTE — ED Triage Notes (Signed)
Pt reports being sob, denies cp. spo2 low at triage. Hx of pulmonary fibrosis.

## 2017-12-16 NOTE — Progress Notes (Signed)
Weaned patient to 4 lpm [place]. spo2 98%

## 2017-12-17 ENCOUNTER — Encounter (HOSPITAL_COMMUNITY): Payer: Self-pay | Admitting: General Practice

## 2017-12-17 ENCOUNTER — Inpatient Hospital Stay (HOSPITAL_COMMUNITY): Payer: Medicare Other

## 2017-12-17 DIAGNOSIS — J849 Interstitial pulmonary disease, unspecified: Secondary | ICD-10-CM

## 2017-12-17 DIAGNOSIS — J810 Acute pulmonary edema: Secondary | ICD-10-CM

## 2017-12-17 DIAGNOSIS — Z86711 Personal history of pulmonary embolism: Secondary | ICD-10-CM

## 2017-12-17 DIAGNOSIS — I10 Essential (primary) hypertension: Secondary | ICD-10-CM

## 2017-12-17 DIAGNOSIS — I509 Heart failure, unspecified: Secondary | ICD-10-CM

## 2017-12-17 DIAGNOSIS — E1140 Type 2 diabetes mellitus with diabetic neuropathy, unspecified: Secondary | ICD-10-CM

## 2017-12-17 DIAGNOSIS — J9612 Chronic respiratory failure with hypercapnia: Secondary | ICD-10-CM

## 2017-12-17 DIAGNOSIS — J84112 Idiopathic pulmonary fibrosis: Secondary | ICD-10-CM

## 2017-12-17 DIAGNOSIS — R0902 Hypoxemia: Secondary | ICD-10-CM

## 2017-12-17 DIAGNOSIS — Z794 Long term (current) use of insulin: Secondary | ICD-10-CM

## 2017-12-17 DIAGNOSIS — J9621 Acute and chronic respiratory failure with hypoxia: Secondary | ICD-10-CM

## 2017-12-17 DIAGNOSIS — J9611 Chronic respiratory failure with hypoxia: Secondary | ICD-10-CM

## 2017-12-17 DIAGNOSIS — E039 Hypothyroidism, unspecified: Secondary | ICD-10-CM

## 2017-12-17 LAB — CBC
HCT: 41.9 % (ref 36.0–46.0)
HEMOGLOBIN: 13.1 g/dL (ref 12.0–15.0)
MCH: 28.7 pg (ref 26.0–34.0)
MCHC: 31.3 g/dL (ref 30.0–36.0)
MCV: 91.9 fL (ref 78.0–100.0)
Platelets: 232 10*3/uL (ref 150–400)
RBC: 4.56 MIL/uL (ref 3.87–5.11)
RDW: 14.3 % (ref 11.5–15.5)
WBC: 12.8 10*3/uL — ABNORMAL HIGH (ref 4.0–10.5)

## 2017-12-17 LAB — CBG MONITORING, ED
GLUCOSE-CAPILLARY: 252 mg/dL — AB (ref 65–99)
Glucose-Capillary: 56 mg/dL — ABNORMAL LOW (ref 65–99)

## 2017-12-17 LAB — BASIC METABOLIC PANEL
Anion gap: 14 (ref 5–15)
BUN: 18 mg/dL (ref 6–20)
CHLORIDE: 91 mmol/L — AB (ref 101–111)
CO2: 36 mmol/L — AB (ref 22–32)
CREATININE: 0.9 mg/dL (ref 0.44–1.00)
Calcium: 9.5 mg/dL (ref 8.9–10.3)
GFR calc Af Amer: >60 mL/min (ref >60)
GFR calc non Af Amer: >60 mL/min (ref >60)
GLUCOSE: 101 mg/dL — AB (ref 65–99)
Potassium: 3.7 mmol/L (ref 3.5–5.1)
Sodium: 141 mmol/L (ref 135–145)

## 2017-12-17 LAB — TROPONIN I: Troponin I: <0.03 ng/mL (ref <0.03)

## 2017-12-17 LAB — MRSA PCR SCREENING: MRSA BY PCR: NEGATIVE

## 2017-12-17 LAB — GLUCOSE, CAPILLARY: Glucose-Capillary: 370 mg/dL — ABNORMAL HIGH (ref 65–99)

## 2017-12-17 MED ORDER — FUROSEMIDE 40 MG PO TABS
60.0000 mg | ORAL_TABLET | Freq: Every day | ORAL | Status: DC
  Administered 2017-12-18 – 2017-12-20 (×3): 60 mg via ORAL
  Filled 2017-12-17 (×3): qty 1

## 2017-12-17 MED ORDER — SALINE SPRAY 0.65 % NA SOLN
1.0000 | NASAL | Status: DC | PRN
  Filled 2017-12-17: qty 44

## 2017-12-17 MED ORDER — MYCOPHENOLATE MOFETIL 250 MG PO CAPS
500.0000 mg | ORAL_CAPSULE | Freq: Two times a day (BID) | ORAL | Status: DC
  Administered 2017-12-17 – 2017-12-20 (×7): 500 mg via ORAL
  Filled 2017-12-17 (×7): qty 2

## 2017-12-17 MED ORDER — ENSURE ENLIVE PO LIQD
237.0000 mL | Freq: Two times a day (BID) | ORAL | Status: DC
  Administered 2017-12-17 – 2017-12-18 (×2): 237 mL via ORAL

## 2017-12-17 MED ORDER — IPRATROPIUM-ALBUTEROL 0.5-2.5 (3) MG/3ML IN SOLN
3.0000 mL | Freq: Three times a day (TID) | RESPIRATORY_TRACT | Status: DC
  Administered 2017-12-17 – 2017-12-19 (×7): 3 mL via RESPIRATORY_TRACT
  Filled 2017-12-17 (×7): qty 3

## 2017-12-17 MED ORDER — FUROSEMIDE 10 MG/ML IJ SOLN
40.0000 mg | Freq: Once | INTRAMUSCULAR | Status: AC
  Administered 2017-12-17: 40 mg via INTRAVENOUS
  Filled 2017-12-17: qty 4

## 2017-12-17 MED ORDER — INSULIN DETEMIR 100 UNIT/ML ~~LOC~~ SOLN
20.0000 [IU] | Freq: Every day | SUBCUTANEOUS | Status: DC
  Administered 2017-12-17 – 2017-12-19 (×3): 20 [IU] via SUBCUTANEOUS
  Filled 2017-12-17 (×4): qty 0.2

## 2017-12-17 MED ORDER — POTASSIUM CHLORIDE CRYS ER 20 MEQ PO TBCR
40.0000 meq | EXTENDED_RELEASE_TABLET | Freq: Once | ORAL | Status: AC
  Administered 2017-12-17: 40 meq via ORAL
  Filled 2017-12-17: qty 2

## 2017-12-17 MED ORDER — ORAL CARE MOUTH RINSE
15.0000 mL | Freq: Two times a day (BID) | OROMUCOSAL | Status: DC
  Administered 2017-12-17 – 2017-12-20 (×6): 15 mL via OROMUCOSAL

## 2017-12-17 MED ORDER — PANTOPRAZOLE SODIUM 40 MG PO TBEC
40.0000 mg | DELAYED_RELEASE_TABLET | Freq: Two times a day (BID) | ORAL | Status: DC
  Administered 2017-12-17 – 2017-12-20 (×6): 40 mg via ORAL
  Filled 2017-12-17 (×6): qty 1

## 2017-12-17 MED ORDER — INFLUENZA VAC SPLIT HIGH-DOSE 0.5 ML IM SUSY
0.5000 mL | PREFILLED_SYRINGE | INTRAMUSCULAR | Status: DC
  Filled 2017-12-17: qty 0.5

## 2017-12-17 NOTE — ED Notes (Signed)
Patient CBG was 252, Nurse was informed.

## 2017-12-17 NOTE — ED Notes (Signed)
Heart Healthy Diet was ordered for Lunch. 

## 2017-12-17 NOTE — Discharge Planning (Signed)
[facility]. States she cannot afford Cellcept- has not taken it in 3 days. Pt was receiving under grant from [employer].   Pt has insurance coverage and [facility] not [facility].

## 2017-12-17 NOTE — Progress Notes (Addendum)
PROGRESS NOTE    Peggy Todd   WUJ:[id number]  DOB: 07/07/1951  DOA: 12/16/2017 PCP: Emi BelfastGessner, Deborah B, FNP   Brief Narrative:  Peggy Todd Peggy Todd is a 67 y.o. female with medical history significant for ILD-pulmonary fibrosis, history of PE and DVT, hypertension, hypothyroidism, DM 2, CAD.  Patient presented to the ED with complaints of worsening shortness of breath over the past 36 hours. Placed on a non-rebreather mask at 10 L. Started on Lasix with the suspicion for fluid overload. Weaned down to 6 L O2.    Subjective: She states she is breathing better. She has some cough with chest congestion. ROS: no complaints of nausea, vomiting, constipation diarrhea, or dysuria. No other complaints.   Assessment & Plan:   Principal Problem:   Acute on chronic respiratory failure with hypoxia / severe ILD -  Currently on diuretics for possibility of fluid overload- BNP was 170-   -  have called pulmonary for opinion as well- she is back on 5 L O2 but when she ambulates pulse ox drops to 70% on 4 L per RN note - ECHO pending - she tells me she cannot afford her Cellcept and stopped it about 3 days ago- I have resumed it and asked for case management to see if it can be obtained for her - cont chronic prednisone - cont IV Lasix per PCCM  Active Problems:     History of pulmonary embolism - treated and taken off of anticoagulation    Hypothyroidism - cont synthroid     Type 2 diabetes mellitus with neurologic complication, with long-term current use of insulin - sugars elevated yesterday but dropped to 50 this AM on home dose of Levemir- will hold off on her AM dose of Levemir and treat with SSI for now- cut back bedtime dose to 20 U   CAD - Plavix  DVT prophylaxis: Lovenox Code Status: DNR Family Communication:  Disposition Plan: cont to follow resp status Consultants:   PCCM Procedures:     Antimicrobials:  Anti-infectives (From admission, onward)   None        Objective: Vitals:   12/17/17 1315 12/17/17 1318 12/17/17 1344 12/17/17 1400  BP:  130/72 122/68   Pulse: 94 94 (!) 107 (!) 109  Resp: (!) 22 (!) 21 (!) 23 (!) 21  Temp:   97.8 F (36.6 C)   TempSrc:   Oral   SpO2: 98% 98% 93% 91%    Intake/Output Summary (Last 24 hours) at 12/17/2017 1453 Last data filed at 12/17/2017 1400 Gross per 24 hour  Intake 240 ml  Output -  Net 240 ml   There were no vitals filed for this visit.  Examination: General exam: Appears comfortable  HEENT: PERRLA, oral mucosa moist, no sclera icterus or thrush Respiratory system: bilateral crackles up to mid lung fields Respiratory effort normal. Cardiovascular system: S1 & S2 heard, RRR.  No murmurs  Gastrointestinal system: Abdomen soft, non-tender, nondistended. Normal bowel sound. No organomegaly Central nervous system: Alert and oriented. No focal neurological deficits. Extremities: No cyanosis, clubbing or edema Skin: No rashes or ulcers Psychiatry:  Mood & affect appropriate.     Data Reviewed: I have personally reviewed following labs and imaging studies  CBC: Recent Labs  Lab 12/16/17 1449 12/17/17 0423  WBC 14.1* 12.8*  HGB 13.8 13.1  HCT 45.5 41.9  MCV 92.9 91.9  PLT 270 232   Basic Metabolic Panel: Recent Labs  Lab 12/16/17 1737 12/17/17 0423  NA  135 141  K 4.9 3.7  CL 88* 91*  CO2 33* 36*  GLUCOSE 525* 101*  BUN 26* 18  CREATININE 1.07* 0.90  CALCIUM 9.6 9.5   GFR: CrCl cannot be calculated (Unknown ideal weight.). Liver Function Tests: No results for input(s): AST, ALT, ALKPHOS, BILITOT, PROT, ALBUMIN in the last 168 hours. No results for input(s): LIPASE, AMYLASE in the last 168 hours. No results for input(s): AMMONIA in the last 168 hours. Coagulation Profile: No results for input(s): INR, PROTIME in the last 168 hours. Cardiac Enzymes: Recent Labs  Lab 12/16/17 2350 12/17/17 0423  TROPONINI <0.03 <0.03   BNP (last 3 results) Recent Labs     10/06/17 0935  PROBNP 207.0*   HbA1C: No results for input(s): HGBA1C in the last 72 hours. CBG: Recent Labs  Lab 12/16/17 2248 12/17/17 0823 12/17/17 1231  GLUCAP 192* 56* 252*   Lipid Profile: No results for input(s): CHOL, HDL, LDLCALC, TRIG, CHOLHDL, LDLDIRECT in the last 72 hours. Thyroid Function Tests: No results for input(s): TSH, T4TOTAL, FREET4, T3FREE, THYROIDAB in the last 72 hours. Anemia Panel: No results for input(s): VITAMINB12, FOLATE, FERRITIN, TIBC, IRON, RETICCTPCT in the last 72 hours. Urine analysis:    Component Value Date/Time   COLORURINE YELLOW 07/09/2017 0705   APPEARANCEUR CLEAR 07/09/2017 0705   LABSPEC 1.013 07/09/2017 0705   PHURINE 6.0 07/09/2017 0705   GLUCOSEU >=500 (A) 07/09/2017 0705   HGBUR NEGATIVE 07/09/2017 0705   BILIRUBINUR NEGATIVE 07/09/2017 0705   KETONESUR 20 (A) 07/09/2017 0705   PROTEINUR 30 (A) 07/09/2017 0705   NITRITE NEGATIVE 07/09/2017 0705   LEUKOCYTESUR NEGATIVE 07/09/2017 0705   Sepsis Labs: @LABRCNTIP (procalcitonin:4,lacticidven:4) )No results found for this or any previous visit (from the past 240 hour(s)).       Radiology Studies: Dg Chest Portable 1 View  Result Date: 12/16/2017 CLINICAL DATA:  Shortness of breath. EXAM: PORTABLE CHEST 1 VIEW COMPARISON:  Two-view chest x-ray 10/06/2017 FINDINGS: The heart is enlarged. Diffuse interstitial edema is again seen. Bilateral effusions are suspected. No significant airspace consolidation is present. The visualized soft tissues and bony thorax are unremarkable. IMPRESSION: 1. Cardiomegaly with diffuse interstitial edema and bilateral effusions likely reflecting congestive heart failure. Electronically Signed   By: Marin Roberts M.D.   On: 12/16/2017 14:57      Scheduled Meds: . clopidogrel  75 mg Oral Daily  . enoxaparin (LOVENOX) injection  40 mg Subcutaneous Daily  . guaiFENesin-dextromethorphan  10 mL Oral QHS  . insulin aspart  0-15 Units  Subcutaneous TID WC  . insulin aspart  14 Units Subcutaneous Once  . insulin detemir  25 Units Subcutaneous QHS  . ipratropium-albuterol  3 mL Nebulization TID  . mycophenolate  500 mg Oral BID  . pantoprazole  40 mg Oral BID AC  . potassium chloride SA  20 mEq Oral Daily  . predniSONE  20 mg Oral Q breakfast  . pregabalin  100 mg Oral BID  . rosuvastatin  20 mg Oral Daily  . sertraline  25 mg Oral Daily   Continuous Infusions:   LOS: 1 day    Time spent in minutes: 35    Calvert Cantor, MD [facility] Pager: [email or web address] Password [email or web address] 12/17/2017, 2:53 PM

## 2017-12-17 NOTE — Consult Note (Addendum)
PULMONARY / CRITICAL CARE MEDICINE   Name: Peggy Todd MRN: [id number] DOB: 1951-02-20    ADMISSION DATE:  12/16/2017 CONSULTATION DATE: 12/17/2017  REFERRING MD: [facility]  CHIEF COMPLAINT: Dyspnea and hypoxia  HISTORY OF PRESENT ILLNESS:   67 year old former smoker quit 20 years ago.  He has had a history of steroid-dependent lung disease, interstitial lung disease and has been under the care of Dr. Jon Billings 8 [facility].  She has been O2 at 5 L dependent for several years.  On CellCept which per her does no good.  She is also been on steroids which seem to do little to improve her respiratory status.  Is been followed by Dr. Sherene Sires at the pulmonary division little change in medical treatment for her interstitial fibrosis and acute and organizing pneumonia.  She presents with a 3-day history of congestion without fevers without chills without sweats and without purulent sputum production.  She she quit taking CellCept on Thursday and developed congestion on Sunday.  Admitted to the hospital 5 L nasal cannula and at time of examination her sats were 98% and she is on prednisone 20 mg daily.  She is in no acute distress.  Breathing easily on examination. Agree with steroids and oxygen with little else to offer at this time.  Per office notes Biopsy results - [facility] reading Interstitial fibrosis with acute and organizing pneumonia. 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 Rare granulomas.  Comment: The histological changes are non-specific and may represent advanced usual interstitial pneumonia or may be a result of previous/ongiong connective tissue disease, aspiration, hypersensitivity, or infection. Clinical correlation and radiographic studies required to further characterize the process underlying this chronic interstitial fibrosis.  She followed up with both [facility] and [facility]      PAST MEDICAL HISTORY :  She  has a past  medical history of Asthma, Depression, Diabetic peripheral angiopathy (HCC), Dvt femoral (deep venous thrombosis) (HCC) (?2015), GERD (gastroesophageal reflux disease), High cholesterol, History of stomach ulcers, Hypertension, On home oxygen therapy ("), Pneumonia (2017), Pulmonary embolism (HCC) (?2015), and Type II diabetes mellitus (HCC).  PAST SURGICAL HISTORY: She  has a past surgical history that includes Appendectomy; Back surgery; Laparoscopic cholecystectomy; Lumbar disc surgery; Esophagogastroduodenoscopy (egd) with esophageal dilation (X 2); Total abdominal hysterectomy; Tubal ligation; and Coronary angioplasty with stent (12/2016).  Allergies  Allergen Reactions  . Oxycodone Anxiety and Other (See Comments)    Patient states she cannot take/ makes her very confused!  . Penicillins Swelling    Has patient had a PCN reaction causing immediate rash, facial/tongue/throat swelling, SOB or lightheadedness with hypotension: Yes Has patient had a PCN reaction causing severe rash involving mucus membranes or skin necrosis: No Has patient had a PCN reaction that required hospitalization: No Has patient had a PCN reaction occurring within the last 10 years: No If all of the above answers are "NO", then may proceed with Cephalosporin use.   . Codeine Other (See Comments) and Anxiety    INSOMNIA, m "makes me hyper"  . Hydrochlorothiazide Other (See Comments)    Abdominal pain  . Atorvastatin Other (See Comments)    Generalized myalgia    No current facility-administered medications on file prior to encounter.    Current Outpatient Medications on File Prior to Encounter  Medication Sig  . carboxymethylcellulose (REFRESH PLUS) 0.5 % SOLN Place 1 drop into both eyes 3 (three) times daily as needed (dry eyes).  . clopidogrel (PLAVIX) 75 MG tablet Take 1 tablet (75  mg total) daily by mouth.  . Dextromethorphan-Guaifenesin (DELSYM CGH/CHEST CONG DM CHILD) 5-100 MG/5ML LIQD Take 10 mLs by  mouth at bedtime.  . furosemide (LASIX) 20 MG tablet Take 3 tablets (60 mg total) by mouth daily.  . insulin aspart (NOVOLOG) 100 UNIT/ML injection Inject 4 Units into the skin 3 (three) times daily with meals.  . Insulin Detemir (LEVEMIR) 100 UNIT/ML Pen Inject 15 units in AM and 25 units in PM (Patient taking differently: Inject 15-25 Units into the skin See admin instructions. Inject 15 units in AM and 25 units in PM)  . KLOR-CON M20 20 MEQ tablet TAKE 1 TABLET BY MOUTH EVERY DAY  . nitroGLYCERIN (NITROSTAT) 0.4 MG SL tablet Place 0.4 mg under the tongue every 5 (five) minutes as needed for chest pain.  . OXYGEN Place 6-10 L into the nose continuous. 6lpm with rest and 8-10 lpm with exertion  . pantoprazole (PROTONIX) 40 MG tablet TAKE 30- 60 MIN BEFORE YOUR FIRST AND LAST MEALS OF THE DAY (Patient taking differently: TAKE 40mg  by mouth twice daily)  . polyethylene glycol (MIRALAX / GLYCOLAX) packet Take 17 g by mouth daily as needed for mild constipation.   . predniSONE (DELTASONE) 10 MG tablet TAKE TWO TABLETS BY MOUTH DAILY WITH BREAKFAST. (Patient taking differently: TAKE TWO TABLETS (20mg ) BY MOUTH DAILY WITH BREAKFAST.)  . pregabalin (LYRICA) 100 MG capsule Take 1 capsule (100 mg total) by mouth 2 (two) times daily.  . rosuvastatin (CRESTOR) 20 MG tablet Take 20 mg by mouth daily.  . sertraline (ZOLOFT) 25 MG tablet Take 25 mg by mouth daily.  . sodium chloride (OCEAN) 0.65 % SOLN nasal spray Place 1 spray into both nostrils as needed for congestion.  . traMADol (ULTRAM) 50 MG tablet TAKE 1 TABLET BY MOUTH EVERY 6 HOURS AS NEEDED FOR PAIN /COUGH (Patient taking differently: Take 50-100 mg by mouth See admin instructions. Take 1 tablet (50mg ) by mouth twice daily, and 2 tablet (100mg ) at bedtime)  . acetaminophen (TYLENOL) 500 MG tablet Take 1,000 mg by mouth every 6 (six) hours as needed.  Marland Kitchen albuterol (PROAIR HFA) 108 (90 Base) MCG/ACT inhaler Inhale 2 puffs into the lungs 4 (four) times  daily as needed.  . mycophenolate (CELLCEPT) 500 MG tablet Take 1 tablet (500 mg total) by mouth 2 (two) times daily. (Patient not taking: Reported on 12/16/2017)    FAMILY HISTORY:  Her indicated that her mother is deceased. She indicated that her father is deceased.   SOCIAL HISTORY: She  reports that she quit smoking about 29 years ago. Her smoking use included cigarettes. She quit after 10.00 years of use. she has never used smokeless tobacco. She reports that she does not drink alcohol or use drugs.  REVIEW OF SYSTEMS:   10 point review of system taken, please see HPI for positives and negatives.   SUBJECTIVE:  67 year old female 5 L nasal cannula without distress with sats of 98%  VITAL SIGNS: BP 107/71   Pulse (!) 104   Temp 97.6 F (36.4 C) (Oral)   Resp (!) 21   SpO2 97%   HEMODYNAMICS:    VENTILATOR SETTINGS:    INTAKE / OUTPUT: No intake/output data recorded.  PHYSICAL EXAMINATION: General: Well-nourished well-developed female on 5 L nasal cannula and sleeping Neuro: Appears intact moves all extremities x4 HEENT: No JVD or lymphadenopathy appreciated Cardiovascular: Heart sounds are regular regular rate and rhythm Lungs: Decreased breath sounds without obvious wheeze Abdomen: Soft nontender Musculoskeletal: Intact Skin:  Without edema  LABS:  BMET Recent Labs  Lab 12/16/17 1737 12/17/17 0423  NA 135 141  K 4.9 3.7  CL 88* 91*  CO2 33* 36*  BUN 26* 18  CREATININE 1.07* 0.90  GLUCOSE 525* 101*    Electrolytes Recent Labs  Lab 12/16/17 1737 12/17/17 0423  CALCIUM 9.6 9.5    CBC Recent Labs  Lab 12/16/17 1449 12/17/17 0423  WBC 14.1* 12.8*  HGB 13.8 13.1  HCT 45.5 41.9  PLT 270 232    Coag's No results for input(s): APTT, INR in the last 168 hours.  Sepsis Markers No results for input(s): LATICACIDVEN, PROCALCITON, O2SATVEN in the last 168 hours.  ABG No results for input(s): PHART, PCO2ART, PO2ART in the last 168  hours.  Liver Enzymes No results for input(s): AST, ALT, ALKPHOS, BILITOT, ALBUMIN in the last 168 hours.  Cardiac Enzymes Recent Labs  Lab 12/16/17 2350 12/17/17 0423  TROPONINI <0.03 <0.03    Glucose Recent Labs  Lab 12/16/17 2248 12/17/17 0823  GLUCAP 192* 56*    Imaging Dg Chest Portable 1 View  Result Date: 12/16/2017 CLINICAL DATA:  Shortness of breath. EXAM: PORTABLE CHEST 1 VIEW COMPARISON:  Two-view chest x-ray 10/06/2017 FINDINGS: The heart is enlarged. Diffuse interstitial edema is again seen. Bilateral effusions are suspected. No significant airspace consolidation is present. The visualized soft tissues and bony thorax are unremarkable. IMPRESSION: 1. Cardiomegaly with diffuse interstitial edema and bilateral effusions likely reflecting congestive heart failure. Electronically Signed   By: Marin Roberts M.D.   On: 12/16/2017 14:57     STUDIES:    CULTURES:   ANTIBIOTICS:   SIGNIFICANT EVENTS:   LINES/TUBES:   DISCUSSION: 67 year old former smoker quit 20 years ago.  He has had a history of steroid-dependent lung disease, interstitial lung disease and has been under the care of Dr. Jon Billings 8 [facility].  She has been O2 at 5 L dependent for several years.  On CellCept which per her does no good.  She is also been on steroids which seem to do little to improve her respiratory status.  Is been followed by Dr. Sherene Sires at the pulmonary division little change in medical treatment for her interstitial fibrosis and acute and organizing pneumonia.  She presents with a 3-day history of congestion without fevers without chills without sweats and without purulent sputum production.  She she quit taking CellCept on Thursday and developed congestion on Sunday.  Admitted to the hospital 5 L nasal cannula and at time of examination her sats were 98% and she is on prednisone 20 mg daily.  She is in no acute distress.  Breathing easily on  examination. Agree with steroids and oxygen with little else to offer at this time.  ASSESSMENT / PLAN:  PULMONARY A: Acute on chronic hypoxic respiratory failure.  Normally on 5 L nasal cannula daily Notes congestion and cough for approximately 3 days prior to admission History of asthma Cyclic cough Remote tobacco abuse Chronic bronchitis History of pneumonia with honeycombing and organizing pneumonia Steroid-dependent Autoimmune disease P:   Continue oxygen as needed She has been on CellCept in the past but is too expensive for her to continue and she notes it really did not know while she was on it.  She is followed by Dr. Jon Billings at [facility] for this.  Again she cannot afford the CellCept. Steroids as needed She is a DNR. Little for pulmonary to add at this point. Repeat CT of  the chest would most likely reveal ongoing process of interstitial lung disease.  CARDIOVASCULAR A:  Hypertension History of PE from DVT in 2013 she is on blood thinners blood sugars has been off after 1 year History of DVT 2D echo on 11/18/2016 with EF 65-35% grade 1 diastolic dysfunction pulmonary artery pressures are within normal limits P:  Per primary  RENAL Lab Results  Component Value Date   CREATININE 0.90 12/17/2017   CREATININE 1.07 (H) 12/16/2017   CREATININE 0.86 11/10/2017   CREATININE 1.18 (H) 11/05/2016   Recent Labs  Lab 12/16/17 1737 12/17/17 0423  K 4.9 3.7    A:   Intermittent renal insufficiency P:   Follow creatinine  GASTROINTESTINAL A:   Gastroesophageal reflux disease P:   PPI  HEMATOLOGIC Recent Labs    12/16/17 1449 12/17/17 0423  HGB 13.8 13.1    A:   No acute issues History of PE P:  Currently on Plavix and Lovenox  INFECTIOUS A:   No apparent infectious process P:   Monitor temperature curve and a WBC count  ENDOCRINE CBG (last 3)  Recent Labs    12/16/17 2248 12/17/17 0823  GLUCAP 192* 56*    A:    Diabetes mellitus Chronic steroid use Hypothyroidism P:   Sliding scale insulin Synthroid  NEUROLOGIC A:   No acute neurological deficits Anxiety depression P:   RASS goal: 0 Careful sedating medications with severe IPF   FAMILY  - Updates: Patient updated at bedside  - Inter-disciplinary family meet or Palliative Care meeting due by:  day 7    Steve Minor ACNP [facility] Pager [phone] till 1 pm If no answer page [phone] 12/17/2017, 10:36 AM  Attending Note:  67 year old female with IPF and chronically on 5L [place] and steroids/cellcept.  On exam, bibasilar crackles.  I reviewed CXR myself pulmonary edema and pleural effusion.  Discussed with PCCM-NP.  IPF:  - No need for cellcept while in the hospital, can evaluate as outpatient  - No need for IV steroids, ok to continue on home dose prednisone  Pulmonary edema:  - Lasix, recommend changing to IV for 2 doses to get more diureses   - Echo  - BNP is elevated, keep dry  Hypoxemia:  - Titrate O2 for sat of 88-92%  - Continue home 5L for now  - May need an ambulatory desat study to see how much O2 is needed with ambulation when closer to discharge  SOB: due heart failure, I do not believe this is related to IPF  - Diureses as above  - May consider involvement of cardiology  PCCM will sign off, please call back if needed.  Patient seen and examined, agree with above note.  I dictated the care and orders written for this patient under my direction.  Alyson ReedyYacoub, Payal Stanforth G, MD [phone]

## 2017-12-17 NOTE — ED Notes (Signed)
Pt placed on hospital bed for comfort, o2 states dropped to 70% on 4L with exertion, back up to 100% on non rebreather for 2 minutes

## 2017-12-17 NOTE — ED Notes (Signed)
Report given to Florentina AddisonKatie, RN on floor. SWAT called for transport

## 2017-12-17 NOTE — Care Management Note (Signed)
Case Management Note  Patient Details  Name: Peggy Todd MRN: [id number] Date of Birth: 02/09/1951  Subjective/Objective:                  67 y.o. female with medical history significant for ILD-pulmonary fibrosis, history of PE and DVT, hypertension, hypothyroidism, DM 2, CAD.  Patient presented to the ED with complaints of worsening shortness of breath over the past 36 hours, increasing O2 requirement. From home with daughter.  Action/Plan: Admit status INPATIENT (RESP FAILURE); anticipate discharge HOME WITH HOME HEALTH.   Expected Discharge Date:  (unknown)               Expected Discharge Plan:  Home w Home Health Services  In-House Referral:     Discharge planning Services  CM Consult  Post Acute Care Choice:    Choice offered to:     DME Arranged:  [facility]xygen [facility]  HH Arranged:    HH Agency:     Status of Service:     If discussed at [employer] Meetings, dates discussed:    Additional Comments: Pt home oxygen company: [facility] [name], Peggy Naegeliamellia, RN 12/17/2017, 11:40 AM

## 2017-12-17 NOTE — Progress Notes (Signed)
Inpatient Diabetes Program Recommendations  AACE/ADA: New Consensus Statement on Inpatient Glycemic Control (2015)  Target Ranges:  Prepandial:   less than 140 mg/dL      Peak postprandial:   less than 180 mg/dL (1-2 hours)      Critically ill patients:  140 - 180 mg/dL   Review of Glycemic Control  Diabetes history: DM 2 Outpatient Diabetes medications: Levemir 15 units qam, 25 units qpm, Novolog 4 units tid Current orders for Inpatient glycemic control: Levemir25 units qpm, Novolog Moderate Correction 0-15 units tid  Inpatient Diabetes Program Recommendations:    Hypoglycemia this am 56 mg/dl. No recheck or treatment noted. Patient received Levemir 25 units after midnight.   Please consider reducing dose to 18 units, Also due to renal function, consider reducing Novolog Correction to Novolog Sensitive Correction 0-9 units tid.  Thanks,  Christena DeemShannon Giannah Zavadil RN, MSN, BC-ADM, [facility] Inpatient Diabetes Coordinator Team Pager [phone] (8a-5p)

## 2017-12-18 DIAGNOSIS — I509 Heart failure, unspecified: Secondary | ICD-10-CM

## 2017-12-18 LAB — BASIC METABOLIC PANEL
ANION GAP: 15 (ref 5–15)
Anion gap: 16 — ABNORMAL HIGH (ref 5–15)
BUN: 26 mg/dL — AB (ref 6–20)
BUN: 29 mg/dL — ABNORMAL HIGH (ref 6–20)
CALCIUM: 9.2 mg/dL (ref 8.9–10.3)
CHLORIDE: 92 mmol/L — AB (ref 101–111)
CO2: 33 mmol/L — AB (ref 22–32)
CO2: 31 mmol/L (ref 22–32)
CREATININE: 0.94 mg/dL (ref 0.44–1.00)
Calcium: 9.7 mg/dL (ref 8.9–10.3)
Chloride: 91 mmol/L — ABNORMAL LOW (ref 101–111)
Creatinine, Ser: 1.18 mg/dL — ABNORMAL HIGH (ref 0.44–1.00)
GFR calc Af Amer: >60 mL/min (ref >60)
GFR calc non Af Amer: >60 mL/min (ref >60)
GFR, EST AFRICAN AMERICAN: 54 mL/min — AB (ref >60)
GFR, EST NON AFRICAN AMERICAN: 47 mL/min — AB (ref >60)
GLUCOSE: 231 mg/dL — AB (ref 65–99)
GLUCOSE: 424 mg/dL — AB (ref 65–99)
Potassium: 4.6 mmol/L (ref 3.5–5.1)
Potassium: 4.6 mmol/L (ref 3.5–5.1)
SODIUM: 137 mmol/L (ref 135–145)
Sodium: 141 mmol/L (ref 135–145)

## 2017-12-18 LAB — URINALYSIS, ROUTINE W REFLEX MICROSCOPIC
BACTERIA UA: NONE SEEN
BILIRUBIN URINE: NEGATIVE
Glucose, UA: >=500 mg/dL — AB
Hgb urine dipstick: NEGATIVE
Ketones, ur: NEGATIVE mg/dL
Leukocytes, UA: NEGATIVE
NITRITE: NEGATIVE
PH: 6 (ref 5.0–8.0)
Protein, ur: NEGATIVE mg/dL
SPECIFIC GRAVITY, URINE: 1.01 (ref 1.005–1.030)
WBC, UA: NONE SEEN WBC/hpf (ref 0–5)

## 2017-12-18 LAB — GLUCOSE, CAPILLARY
GLUCOSE-CAPILLARY: 159 mg/dL — AB (ref 65–99)
GLUCOSE-CAPILLARY: 553 mg/dL — AB (ref 65–99)
GLUCOSE-CAPILLARY: 283 mg/dL — AB (ref 65–99)
GLUCOSE-CAPILLARY: 392 mg/dL — AB (ref 65–99)
Glucose-Capillary: 248 mg/dL — ABNORMAL HIGH (ref 65–99)
Glucose-Capillary: 333 mg/dL — ABNORMAL HIGH (ref 65–99)

## 2017-12-18 LAB — ECHOCARDIOGRAM COMPLETE

## 2017-12-18 MED ORDER — GLUCERNA SHAKE PO LIQD
237.0000 mL | Freq: Two times a day (BID) | ORAL | Status: DC
  Administered 2017-12-18 – 2017-12-19 (×3): 237 mL via ORAL

## 2017-12-18 MED ORDER — DM-GUAIFENESIN ER 30-600 MG PO TB12
1.0000 | ORAL_TABLET | Freq: Two times a day (BID) | ORAL | Status: DC
  Administered 2017-12-18 – 2017-12-20 (×5): 1 via ORAL
  Filled 2017-12-18 (×5): qty 1

## 2017-12-18 NOTE — Progress Notes (Signed)
Initial Nutrition Assessment  DOCUMENTATION CODES:   Not applicable  INTERVENTION:    D/C Ensure Enlive  Glucerna Shake po BID, each supplement provides 220 kcal and 10 grams of protein  NUTRITION DIAGNOSIS:   Increased nutrient needs related to acute illness as evidenced by estimated needs  GOAL:   Patient will meet greater than or equal to 90% of their needs  MONITOR:   PO intake, Supplement acceptance, Labs, Skin, Weight trends, I & O's  REASON FOR ASSESSMENT:   Malnutrition Screening Tool  ASSESSMENT:   67 y.o. Female with PMH significant for ILD-pulmonary fibrosis, history of PE and DVT, DM 2, CAD.  Patient presented to the ED with complaints of worsening shortness of breath.  RD spoke with pt at bedside. Reports a decreased appetite. Ate most of her breakfast this am. Pt typically consumes 2 meals per day at home:  Breakfast: croissant  Lunch: Boost supplement Dinner: meat, starch, veggie  She reveals that her weight has increased recently. Currently has Ensure Enlive supplements ordered BID. Pt likes. Will change to Glucerna Shake to help with glycemic control. Labs and medications reviewed. CBG's [phone].  NUTRITION - FOCUSED PHYSICAL EXAM:  Completed. No muscle or fat depletion noticed.  Diet Order:  Diet heart healthy/carb modified Room service appropriate? Yes; Fluid consistency: Thin  EDUCATION NEEDS:   No education needs have been identified at this time  Skin:  Skin Assessment: Reviewed RN Assessment  Last BM:  3/21   Intake/Output Summary (Last 24 hours) at 12/18/2017 1417 Last data filed at 12/18/2017 1218 Gross per 24 hour  Intake 1198 ml  Output 1200 ml  Net -2 ml   Height:   Ht Readings from Last 1 Encounters:  12/17/17 5' (1.524 m)   Weight:   Wt Readings from Last 1 Encounters:  12/18/17 135 lb 9.3 oz (61.5 kg)   Ideal Body Weight:  45.4 kg  BMI:  Body mass index is 26.48 kg/m.  Estimated Nutritional Needs:    Kcal:  1800-2000  Protein:  90-105 gm  Fluid:  1.8-2.0 L  Maureen ChattersKatie Atalaya Zappia, RD, LDN Pager #: [phone] After-Hours Pager #: [phone]

## 2017-12-18 NOTE — Progress Notes (Signed)
Ambulated w/ pt in room from bed to door and back. [place] @ 7L. SpO2 dropped into high 70s, RR 30s, HR 120s. SpO2 recovered to low 90s once back in bed and [place] increased to 10L.

## 2017-12-18 NOTE — Progress Notes (Addendum)
patient Diabetes Program Recommendations  AACE/ADA: New Consensus Statement on Inpatient Glycemic Control (2015)  Target Ranges:  Prepandial:   less than 140 mg/dL      Peak postprandial:   less than 180 mg/dL (1-2 hours)      Critically ill patients:  140 - 180 mg/dL   Review of Glycemic Control  Diabetes history: DM 2 Outpatient Diabetes medications: Levemir 15 units qam, 25 units qpm, Novolog 4 units tid Current orders for Inpatient glycemic control: Levemir25 units qpm, Novolog Moderate Correction 0-15 units tid  Inpatient Diabetes Program Recommendations:    Fasting glucose at target at 159 mg/dl. Glucose increased significantly after PO prednisone dose, meal supplement, and patient eating 100%.   Please consider Novolog 5 units tid meal coverage when patient consumes at least 50% of meals.  Noted one time dose of Novolog 14 units ordered. Thanks,  Christena DeemShannon Victor Granados RN, MSN, BC-ADM, [facility] Inpatient Diabetes Coordinator Team Pager [phone] (8a-5p)

## 2017-12-18 NOTE — Plan of Care (Signed)
  Problem: Clinical Measurements: Goal: Respiratory complications will improve Outcome: Not Progressing Note:  Pt continues to have diffuse fine crackles on auscultation, O2 saturation 96 on 5 L/min [place]

## 2017-12-18 NOTE — Progress Notes (Signed)
Pts SpO2 in 70s on 5L, have switched out SpO2 monitor, good pleth/reading. Switched to HFNC at 8L, SpO2 now in low 90s/high 80s, RR in low 20s.  Have spoken w/ RT to relay, will come reassess pt. Pt in no apparent distress. HR still in 120s. All relayed to attending MD

## 2017-12-18 NOTE — Progress Notes (Signed)
PROGRESS NOTE    Peggy Todd   UJW:[id number]  DOB: 1951-01-23  DOA: 12/16/2017 PCP: Emi Belfast, FNP   Brief Narrative:  Peggy Todd is a 67 y.o. female with medical history significant for ILD-pulmonary fibrosis, history of PE and DVT, hypertension, hypothyroidism, DM 2, CAD.  Patient presented to the ED with complaints of worsening shortness of breath over the past 36 hours. Placed on a non-rebreather mask at 10 L. Started on Lasix with the suspicion for fluid overload. Weaned down to 6 L O2.    Subjective: Feels she may be breathing a little better. Has a dry (chronic) cough today which she usually takes Delsym for at home ROS: no complaints of nausea, vomiting, constipation diarrhea, or dysuria. No other complaints.   Assessment & Plan:   Principal Problem:   Acute on chronic respiratory failure with hypoxia / severe ILD -  Currently on diuretics for possibility of fluid overload- BNP was 170-   -  have called pulmonary for opinion as well- she is back on 5 L O2 but when she ambulates pulse ox drops to 70% on 4 L per RN note - she tells me she cannot afford her Cellcept and stopped it about 3 days ago- I have resumed it and asked for case management to see if it can be obtained for her - cont chronic prednisone - cont IV Lasix per PCCM- ECHO has been done but report pending  Active Problems:     History of pulmonary embolism - treated and taken off of anticoagulation    Hypothyroidism - cont synthroid     Type 2 diabetes mellitus with neurologic complication, with long-term current use of insulin - episode of hypoglycemia yesterday - 3/21- sugars now quite elevated- will resume AM Levemir tomorrow   CAD - Plavix  DVT prophylaxis: Lovenox Code Status: DNR Family Communication:  Disposition Plan: cont to follow resp status Consultants:   PCCM Procedures:    2 D ECHO Antimicrobials:  Anti-infectives (From admission, onward)   None        Objective: Vitals:   12/18/17 1100 12/18/17 1200 12/18/17 1227 12/18/17 1300  BP:   121/64   Pulse: (!) 111 (!) 110 (!) 112 (!) 114  Resp: (!) 30 (!) 26 (!) 24 19  Temp:   97.6 F (36.4 C)   TempSrc:   Oral   SpO2: 100% 97% 95% 95%  Weight:      Height:        Intake/Output Summary (Last 24 hours) at 12/18/2017 1334 Last data filed at 12/18/2017 1218 Gross per 24 hour  Intake 1438 ml  Output 1200 ml  Net 238 ml   Filed Weights   12/17/17 1710 12/18/17 0509  Weight: 61.5 kg (135 lb 9.3 oz) 61.5 kg (135 lb 9.3 oz)    Examination: General exam: Appears comfortable  HEENT: PERRLA, oral mucosa moist, no sclera icterus or thrush Respiratory system: still with bilateral crackles up to mid lung fields - dry cough noted - Respiratory effort normal. Cardiovascular system: S1 & S2 heard, RRR.  No murmurs  Gastrointestinal system: Abdomen soft, non-tender, nondistended. Normal bowel sound. No organomegaly Central nervous system: Alert and oriented. No focal neurological deficits. Extremities: No cyanosis, clubbing or edema Skin: No rashes or ulcers Psychiatry:  Mood & affect appropriate.     Data Reviewed: I have personally reviewed following labs and imaging studies  CBC: Recent Labs  Lab 12/16/17 1449 12/17/17 0423  WBC 14.1* 12.8*  HGB 13.8 13.1  HCT 45.5 41.9  MCV 92.9 91.9  PLT 270 232   Basic Metabolic Panel: Recent Labs  Lab 12/16/17 1737 12/17/17 0423 12/18/17 0336  NA 135 141 141  K 4.9 3.7 4.6  CL 88* 91* 92*  CO2 33* 36* 33*  GLUCOSE 525* 101* 231*  BUN 26* 18 26*  CREATININE 1.07* 0.90 0.94  CALCIUM 9.6 9.5 9.7   GFR: Estimated Creatinine Clearance: 48.2 mL/min (by C-G formula based on SCr of 0.94 mg/dL). Liver Function Tests: No results for input(s): AST, ALT, ALKPHOS, BILITOT, PROT, ALBUMIN in the last 168 hours. No results for input(s): LIPASE, AMYLASE in the last 168 hours. No results for input(s): AMMONIA in the last 168  hours. Coagulation Profile: No results for input(s): INR, PROTIME in the last 168 hours. Cardiac Enzymes: Recent Labs  Lab 12/16/17 2350 12/17/17 0423  TROPONINI <0.03 <0.03   BNP (last 3 results) Recent Labs    10/06/17 0935  PROBNP 207.0*   HbA1C: No results for input(s): HGBA1C in the last 72 hours. CBG: Recent Labs  Lab 12/18/17 0014 12/18/17 0340 12/18/17 0745 12/18/17 1221 12/18/17 1225  GLUCAP 333* 248* 159* 583* 553*   Lipid Profile: No results for input(s): CHOL, HDL, LDLCALC, TRIG, CHOLHDL, LDLDIRECT in the last 72 hours. Thyroid Function Tests: No results for input(s): TSH, T4TOTAL, FREET4, T3FREE, THYROIDAB in the last 72 hours. Anemia Panel: No results for input(s): VITAMINB12, FOLATE, FERRITIN, TIBC, IRON, RETICCTPCT in the last 72 hours. Urine analysis:    Component Value Date/Time   COLORURINE YELLOW 07/09/2017 0705   APPEARANCEUR CLEAR 07/09/2017 0705   LABSPEC 1.013 07/09/2017 0705   PHURINE 6.0 07/09/2017 0705   GLUCOSEU >=500 (A) 07/09/2017 0705   HGBUR NEGATIVE 07/09/2017 0705   BILIRUBINUR NEGATIVE 07/09/2017 0705   KETONESUR 20 (A) 07/09/2017 0705   PROTEINUR 30 (A) 07/09/2017 0705   NITRITE NEGATIVE 07/09/2017 0705   LEUKOCYTESUR NEGATIVE 07/09/2017 0705   Sepsis Labs: @LABRCNTIP (procalcitonin:4,lacticidven:4) ) Recent Results (from the past 240 hour(s))  MRSA PCR Screening     Status: None   Collection Time: 12/17/17  1:50 PM  Result Value Ref Range Status   MRSA by PCR NEGATIVE NEGATIVE Final    Comment:        The GeneXpert MRSA Assay (FDA approved for NASAL specimens only), is one component of a comprehensive MRSA colonization surveillance program. It is not intended to diagnose MRSA infection nor to guide or monitor treatment for MRSA infections. Performed at [facility], [address] [place], [place] [address]          Radiology Studies: Dg Chest Portable 1 View  Result Date: 12/16/2017 CLINICAL  DATA:  Shortness of breath. EXAM: PORTABLE CHEST 1 VIEW COMPARISON:  Two-view chest x-ray 10/06/2017 FINDINGS: The heart is enlarged. Diffuse interstitial edema is again seen. Bilateral effusions are suspected. No significant airspace consolidation is present. The visualized soft tissues and bony thorax are unremarkable. IMPRESSION: 1. Cardiomegaly with diffuse interstitial edema and bilateral effusions likely reflecting congestive heart failure. Electronically Signed   By: Marin Robertshristopher  Mattern M.D.   On: 12/16/2017 14:57      Scheduled Meds: . clopidogrel  75 mg Oral Daily  . dextromethorphan-guaiFENesin  1 tablet Oral BID  . enoxaparin (LOVENOX) injection  40 mg Subcutaneous Daily  . feeding supplement (ENSURE ENLIVE)  237 mL Oral BID BM  . furosemide  60 mg Oral Daily  . Influenza vac split quadrivalent PF  0.5 mL  Intramuscular Tomorrow-1000  . insulin aspart  0-15 Units Subcutaneous TID WC  . insulin aspart  14 Units Subcutaneous Once  . insulin detemir  20 Units Subcutaneous QHS  . ipratropium-albuterol  3 mL Nebulization TID  . mouth rinse  15 mL Mouth Rinse BID  . mycophenolate  500 mg Oral BID  . pantoprazole  40 mg Oral BID AC  . potassium chloride SA  20 mEq Oral Daily  . predniSONE  20 mg Oral Q breakfast  . pregabalin  100 mg Oral BID  . rosuvastatin  20 mg Oral Daily  . sertraline  25 mg Oral Daily   Continuous Infusions:   LOS: 2 days    Time spent in minutes: 35    Calvert Cantor, MD [facility] Pager: [email or web address] Password [facility] 12/18/2017, 1:34 PM

## 2017-12-18 NOTE — Progress Notes (Signed)
Have not heard from MD, 15 units novolog given, stat BMP ordered. Spoke w/ Diabetes Educator who advised pt should be fine to eat, however recommended order for meal coverage.

## 2017-12-18 NOTE — Progress Notes (Signed)
Pts CBG showing 583, repeat 553. Messaged attending MD re:" do you want me to give 15U or get stat lab?"

## 2017-12-19 ENCOUNTER — Inpatient Hospital Stay (HOSPITAL_COMMUNITY): Payer: Medicare Other

## 2017-12-19 LAB — BASIC METABOLIC PANEL
ANION GAP: 12 (ref 5–15)
BUN: 21 mg/dL — ABNORMAL HIGH (ref 6–20)
CO2: 31 mmol/L (ref 22–32)
Calcium: 9.1 mg/dL (ref 8.9–10.3)
Chloride: 88 mmol/L — ABNORMAL LOW (ref 101–111)
Creatinine, Ser: 1.11 mg/dL — ABNORMAL HIGH (ref 0.44–1.00)
GFR calc Af Amer: 59 mL/min — ABNORMAL LOW (ref >60)
GFR calc non Af Amer: 51 mL/min — ABNORMAL LOW (ref >60)
Glucose, Bld: 622 mg/dL (ref 65–99)
POTASSIUM: 5.2 mmol/L — AB (ref 3.5–5.1)
Sodium: 131 mmol/L — ABNORMAL LOW (ref 135–145)

## 2017-12-19 LAB — GLUCOSE, CAPILLARY
GLUCOSE-CAPILLARY: 387 mg/dL — AB (ref 65–99)
GLUCOSE-CAPILLARY: 176 mg/dL — AB (ref 65–99)
Glucose-Capillary: 583 mg/dL (ref 65–99)
Glucose-Capillary: 112 mg/dL — ABNORMAL HIGH (ref 65–99)
Glucose-Capillary: 582 mg/dL (ref 65–99)
Glucose-Capillary: 385 mg/dL — ABNORMAL HIGH (ref 65–99)

## 2017-12-19 MED ORDER — ONDANSETRON HCL 4 MG/2ML IJ SOLN
4.0000 mg | Freq: Three times a day (TID) | INTRAMUSCULAR | Status: DC | PRN
  Administered 2017-12-19: 4 mg via INTRAVENOUS
  Filled 2017-12-19: qty 2

## 2017-12-19 MED ORDER — INSULIN DETEMIR 100 UNIT/ML ~~LOC~~ SOLN
12.0000 [IU] | Freq: Every day | SUBCUTANEOUS | Status: DC
  Administered 2017-12-19: 12 [IU] via SUBCUTANEOUS
  Filled 2017-12-19 (×2): qty 0.12

## 2017-12-19 MED ORDER — IOPAMIDOL (ISOVUE-300) INJECTION 61%
INTRAVENOUS | Status: AC
  Administered 2017-12-19: 75 mL
  Filled 2017-12-19: qty 75

## 2017-12-19 MED ORDER — IPRATROPIUM-ALBUTEROL 0.5-2.5 (3) MG/3ML IN SOLN
3.0000 mL | Freq: Two times a day (BID) | RESPIRATORY_TRACT | Status: DC
  Administered 2017-12-19 – 2017-12-20 (×2): 3 mL via RESPIRATORY_TRACT
  Filled 2017-12-19 (×2): qty 3

## 2017-12-19 MED ORDER — ALPRAZOLAM 0.25 MG PO TABS
0.2500 mg | ORAL_TABLET | Freq: Every evening | ORAL | Status: DC | PRN
  Administered 2017-12-19: 0.25 mg via ORAL
  Filled 2017-12-19: qty 1

## 2017-12-19 MED ORDER — INSULIN ASPART 100 UNIT/ML ~~LOC~~ SOLN
4.0000 [IU] | Freq: Three times a day (TID) | SUBCUTANEOUS | Status: DC
  Administered 2017-12-19 – 2017-12-20 (×2): 4 [IU] via SUBCUTANEOUS

## 2017-12-19 MED ORDER — ZOLPIDEM TARTRATE 5 MG PO TABS
5.0000 mg | ORAL_TABLET | Freq: Once | ORAL | Status: AC
  Administered 2017-12-19: 5 mg via ORAL
  Filled 2017-12-19: qty 1

## 2017-12-19 NOTE — Care Management Important Message (Signed)
Important Message  Patient Details  Name: Peggy Todd MRN: [id number] Date of Birth: 03/04/1951   Medicare Important Message Given:  Yes    Patterson Hollenbaugh 12/19/2017, 1:55 PM

## 2017-12-19 NOTE — Progress Notes (Signed)
#   5. S/W KADASIA  @ [employer]	[place] # [id number] [id number]    BRAND :  1. CELLCEPT  500 MG BID  COVER- YES  CO-PAY- $ 29.20  PRIOR APPROVAL-  YES # [id number]    GENERIC:  2 . MYCOPHENOLATE 500 MG BID  COVER- YES  CO-PAY- $ 45.00  PRIOR APPROVAL- NO    PREFERRED PHARMACY -YES- [facility]

## 2017-12-19 NOTE — Progress Notes (Signed)
NCM documented benefit check for cellcept and the gerneric brand, informed of the cost of medications, patient does not want to take this medication any more. She is for discharege today.

## 2017-12-19 NOTE — Discharge Instructions (Signed)
Take 40 mg of Lasix if you gain 2-3 lbs in 2-3 days and become more short of breath.    Please take all your medications with you for your next visit with your Primary MD. Please request your Primary MD to go over all hospital test results at the follow up. Please ask your Primary MD to get all Hospital records sent to his/her office.  If you experience worsening of your admission symptoms, develop shortness of breath, chest pain, suicidal or homicidal thoughts or a life threatening emergency, you must seek medical attention immediately by calling 911 or calling your MD.  Peggy QuinYou must read the complete instructions/literature along with all the possible adverse reactions/side effects for all the medicines you take including new medications that have been prescribed to you. Take new medicines after you have completely understood and accpet all the possible adverse reactions/side effects.   Do not drive when taking pain medications or sedatives.    Do not take more than prescribed Pain, Sleep and Anxiety Medications  If you have smoked or chewed Tobacco in the last 2 yrs please stop. Stop any regular alcohol and or recreational drug use.  Wear Seat belts while driving.

## 2017-12-19 NOTE — Discharge Summary (Addendum)
Physician Discharge Summary  Peggy Todd WUJ:[id number] DOB: 01/28/1951 DOA: 12/16/2017  PCP: Emi BelfastGessner, Deborah B, FNP  Admit date: 12/16/2017 Discharge date: 12/20/2017  Admitted From: home Disposition:  home   Recommendations for Outpatient Follow-up:  1. Consider hospice at home  Discharge Condition:  stable   CODE STATUS:  DNR Consultations:  PCCM   Discharge Diagnoses:  Principal Problem:   Acute on chronic respiratory failure with hypoxia (HCC) Active Problems:   ILD (interstitial lung disease) (HCC)   History of pulmonary embolism   Essential hypertension   Hypothyroidism   Pulmonary edema   Type 2 diabetes mellitus with neurologic complication, with long-term current use of insulin (HCC)   CAD (coronary artery disease)    Subjective: Feels less short of breath with ambulating now  Brief Summary: Peggy Leveeeresa Cadet Natallie Curryis a 67 y.o.femalewith medical history significantfor ILD-pulmonary fibrosis,history of PE and DVT,hypertension,hypothyroidism,DM 2,CAD.Patient presented to the ED with complaints of worsening shortness of breath over the past 36 hours. Placed on a non-rebreather mask at 10 L. Started on Lasix with the suspicion for fluid overload. Weaned down to 6 L O2.    Hospital Course:    Principal Problem:   Acute on chronic respiratory failure with hypoxia / severe ILD  - BNP was 170-   -    called pulmonary for opinion as well who recommended to continue diuresis and signed off - she tells me she cannot afford her Cellcept and stopped it about 3 days ago- she was obtaining it under a grant from [employer] - case management [facility]- she states it never worked for her- per pulmonary, she can stop it - cont chronic prednisone  - ECHO  - EF is vigorous but diastolic function cannot be assessed- she has mod to severe RV failure - due to h/o PE- CTA chest: Bilateral chronic interstitial lung  disease with centrilobular and paraseptal emphysema and central bronchiectasis. -  still becoming hypoxic on exertion but patient and daughter state this is her baseline- the patient feels much better than when she first arrived in the ER in regards to dyspnea -  At baseline she requires about 5 L O2 at rest and 8-10 L O2 when ambulating -  would recommend that PCP have discussion about hospice. - Note : K+ never dropped with diuresis and does not need to be on K+ replacement with diuretics  Active Problems:     History of pulmonary embolism - treated and taken off of anticoagulation    Hypothyroidism - cont synthroid     Type 2 diabetes mellitus with neurologic complication, with long-term current use of insulin - sugars were erratic in the hospital possibly because she was not eating the way she does at home and also was waking up late for the past 2 days - cont home doses of Insulins noted below  Vomiting - had some vomiting after meals yesterday and did not eat the rest of her meals-  today, she has not had any vomiting and has eaten well.  CAD - Plavix  Procedures:    2 D ECHO - Left ventricle: The cavity size was normal. Wall thickness was   normal. Systolic function was vigorous. The estimated ejection   fraction was in the range of 65% to 70%. The study is not   technically sufficient to allow evaluation of LV diastolic   function. - Right ventricle: The cavity size was moderately  dilated. Systolic   function was moderately to severely reduced. - Pulmonary arteries: PA peak pressure: 38 mm Hg (S). - Pericardium, extracardiac: A trivial pericardial effusion was   identified.    Discharge Exam: Vitals:   12/20/17 1300 12/20/17 1400  BP:    Pulse: 98 94  Resp: (!) 25 (!) 22  Temp:    SpO2: (!) 89% 94%   Vitals:   12/20/17 1106 12/20/17 1200 12/20/17 1300 12/20/17 1400  BP: 128/63     Pulse: 86 88 98 94  Resp: (!) 21 (!) 23 (!) 25 (!) 22  Temp: 97.8 F (36.6  C)     TempSrc: Oral     SpO2: 100% 99% (!) 89% 94%  Weight:      Height:        General: Pt is alert, awake, not in acute distress Cardiovascular: RRR, S1/S2 +, no rubs, no gallops Respiratory: coarse crackles bilaterally, no wheezing, no rhonchi Abdominal: Soft, NT, ND, bowel sounds + Extremities: no edema, no cyanosis   Discharge Instructions  Discharge Instructions    Diet - low sodium heart healthy   Complete by:  As directed    Increase activity slowly   Complete by:  As directed      Allergies as of 12/20/2017      Reactions   Oxycodone Anxiety, Other (See Comments)   Patient states she cannot take/ makes her very confused!   Penicillins Swelling   Has patient had a PCN reaction causing immediate rash, facial/tongue/throat swelling, SOB or lightheadedness with hypotension: Yes Has patient had a PCN reaction causing severe rash involving mucus membranes or skin necrosis: No Has patient had a PCN reaction that required hospitalization: No Has patient had a PCN reaction occurring within the last 10 years: No If all of the above answers are "NO", then may proceed with Cephalosporin use.   Codeine Other (See Comments), Anxiety   INSOMNIA, m "makes me hyper"   Hydrochlorothiazide Other (See Comments)   Abdominal pain   Atorvastatin Other (See Comments)   Generalized myalgia      Medication List    TAKE these medications   acetaminophen 500 MG tablet Commonly known as:  TYLENOL Take 1,000 mg by mouth every 6 (six) hours as needed.   carboxymethylcellulose 0.5 % Soln Commonly known as:  REFRESH PLUS Place 1 drop into both eyes 3 (three) times daily as needed (dry eyes).   clopidogrel 75 MG tablet Commonly known as:  PLAVIX Take 1 tablet (75 mg total) daily by mouth.   DELSYM CGH/CHEST CONG DM CHILD 5-100 MG/5ML Liqd Generic drug:  Dextromethorphan-guaiFENesin Take 10 mLs by mouth at bedtime.   furosemide 20 MG tablet Commonly known as:  LASIX Take 3  tablets (60 mg total) by mouth daily.   insulin aspart 100 UNIT/ML injection Commonly known as:  novoLOG Inject 4 Units into the skin 3 (three) times daily with meals.   Insulin Detemir 100 UNIT/ML Pen Commonly known as:  LEVEMIR Inject 15 units in AM and 25 units in PM What changed:    how much to take  how to take this  when to take this  additional instructions   KLOR-CON M20 20 MEQ tablet Generic drug:  potassium chloride SA TAKE 1 TABLET BY MOUTH EVERY DAY   mycophenolate 500 MG tablet Commonly known as:  CELLCEPT Take 1 tablet (500 mg total) by mouth 2 (two) times daily.   nitroGLYCERIN 0.4 MG SL tablet Commonly known as:  NITROSTAT Place 0.4 mg under the tongue every 5 (five) minutes as needed for chest pain.   OXYGEN Place 6-10 L into the nose continuous. 6lpm with rest and 8-10 lpm with exertion   pantoprazole 40 MG tablet Commonly known as:  PROTONIX TAKE 30- 60 MIN BEFORE YOUR FIRST AND LAST MEALS OF THE DAY What changed:  See the new instructions.   polyethylene glycol packet Commonly known as:  MIRALAX / GLYCOLAX Take 17 g by mouth daily as needed for mild constipation.   predniSONE 10 MG tablet Commonly known as:  DELTASONE TAKE TWO TABLETS BY MOUTH DAILY WITH BREAKFAST. What changed:  See the new instructions.   pregabalin 100 MG capsule Commonly known as:  LYRICA Take 1 capsule (100 mg total) by mouth 2 (two) times daily.   PROAIR HFA 108 (90 Base) MCG/ACT inhaler Generic drug:  albuterol Inhale 2 puffs into the lungs 4 (four) times daily as needed.   rosuvastatin 20 MG tablet Commonly known as:  CRESTOR Take 20 mg by mouth daily.   sertraline 25 MG tablet Commonly known as:  ZOLOFT Take 25 mg by mouth daily.   sodium chloride 0.65 % Soln nasal spray Commonly known as:  OCEAN Place 1 spray into both nostrils as needed for congestion.   traMADol 50 MG tablet Commonly known as:  ULTRAM TAKE 1 TABLET BY MOUTH EVERY 6 HOURS AS NEEDED  FOR PAIN /COUGH What changed:    how much to take  how to take this  when to take this  additional instructions      Follow-up Information    Emi Belfast, FNP Follow up in 1 week(s).   Specialties:  Nurse Practitioner, Family Medicine Contact information: [address] [place] [address] [phone]          Allergies  Allergen Reactions  . Oxycodone Anxiety and Other (See Comments)    Patient states she cannot take/ makes her very confused!  . Penicillins Swelling    Has patient had a PCN reaction causing immediate rash, facial/tongue/throat swelling, SOB or lightheadedness with hypotension: Yes Has patient had a PCN reaction causing severe rash involving mucus membranes or skin necrosis: No Has patient had a PCN reaction that required hospitalization: No Has patient had a PCN reaction occurring within the last 10 years: No If all of the above answers are "NO", then may proceed with Cephalosporin use.   . Codeine Other (See Comments) and Anxiety    INSOMNIA, m "makes me hyper"  . Hydrochlorothiazide Other (See Comments)    Abdominal pain  . Atorvastatin Other (See Comments)    Generalized myalgia     Procedures/Studies:  Ct Chest W Contrast  Result Date: 12/19/2017 CLINICAL DATA:  Cough. History of pulmonary fibrosis. Shortness of breath. EXAM: CT CHEST WITH CONTRAST TECHNIQUE: Multidetector CT imaging of the chest was performed during intravenous contrast administration. CONTRAST:  75mL ISOVUE-300 IOPAMIDOL (ISOVUE-300) INJECTION 61% COMPARISON:  Portable chest obtained earlier today. FINDINGS: Cardiovascular: Enlarged heart. Coronary artery stent. Minimal pericardial effusion with a maximum thickness of 7 mm. Mediastinum/Nodes: No enlarged mediastinal, hilar, or axillary lymph nodes. Thyroid gland, trachea, and esophagus demonstrate no significant findings. Lungs/Pleura: Diffusely prominent interstitial markings with bilateral bullous changes and  slid occult bronchiectasis. Upper Abdomen: Diffuse low density of the liver relative to the spleen. The liver contours are lobulated with a prominent lateral segment left lobe and caudate lobe. Normal sized spleen. Musculoskeletal: Mild thoracic spine degenerative changes. Cervical spine fixation hardware.  IMPRESSION: 1. Bilateral chronic interstitial lung disease with centrilobular and paraseptal emphysema and central bronchiectasis. 2. Diffuse hepatic steatosis. 3. Minimal pericardial effusion. 4. Cardiomegaly. Emphysema (ICD10-J43.9). Electronically Signed   By: Beckie Salts M.D.   On: 12/19/2017 15:37   Dg Chest Port 1 View  Result Date: 12/19/2017 CLINICAL DATA:  Hypoxia EXAM: PORTABLE CHEST 1 VIEW COMPARISON:  12/16/2017 FINDINGS: Cardiac shadow is again enlarged. Coronary stenting is again seen. Diffuse interstitial changes are again identified bilaterally stable from the prior exam. No new focal confluent infiltrate is seen. IMPRESSION: Stable interstitial changes bilaterally. Electronically Signed   By: Alcide Clever M.D.   On: 12/19/2017 07:51   Dg Chest Portable 1 View  Result Date: 12/16/2017 CLINICAL DATA:  Shortness of breath. EXAM: PORTABLE CHEST 1 VIEW COMPARISON:  Two-view chest x-ray 10/06/2017 FINDINGS: The heart is enlarged. Diffuse interstitial edema is again seen. Bilateral effusions are suspected. No significant airspace consolidation is present. The visualized soft tissues and bony thorax are unremarkable. IMPRESSION: 1. Cardiomegaly with diffuse interstitial edema and bilateral effusions likely reflecting congestive heart failure. Electronically Signed   By: Marin Roberts M.D.   On: 12/16/2017 14:57     The results of significant diagnostics from this hospitalization (including imaging, microbiology, ancillary and laboratory) are listed below for reference.     Microbiology: Recent Results (from the past 240 hour(s))  MRSA PCR Screening     Status: None   Collection  Time: 12/17/17  1:50 PM  Result Value Ref Range Status   MRSA by PCR NEGATIVE NEGATIVE Final    Comment:        The GeneXpert MRSA Assay (FDA approved for NASAL specimens only), is one component of a comprehensive MRSA colonization surveillance program. It is not intended to diagnose MRSA infection nor to guide or monitor treatment for MRSA infections. Performed at [facility], [address], [place], [place] [address]      Labs: BNP (last 3 results) Recent Labs    12/16/17 1737  BNP 170.3*   Basic Metabolic Panel: Recent Labs  Lab 12/17/17 0423 12/18/17 0336 12/18/17 1351 12/19/17 1705 12/20/17 1303  NA 141 141 137 131* 138  K 3.7 4.6 4.6 5.2* 4.0  CL 91* 92* 91* 88* 93*  CO2 36* 33* 31 31 31   GLUCOSE 101* 231* 424* 622* 148*  BUN 18 26* 29* 21* 21*  CREATININE 0.90 0.94 1.18* 1.11* 0.93  CALCIUM 9.5 9.7 9.2 9.1 9.3   Liver Function Tests: No results for input(s): AST, ALT, ALKPHOS, BILITOT, PROT, ALBUMIN in the last 168 hours. No results for input(s): LIPASE, AMYLASE in the last 168 hours. No results for input(s): AMMONIA in the last 168 hours. CBC: Recent Labs  Lab 12/16/17 1449 12/17/17 0423  WBC 14.1* 12.8*  HGB 13.8 13.1  HCT 45.5 41.9  MCV 92.9 91.9  PLT 270 232   Cardiac Enzymes: Recent Labs  Lab 12/16/17 2350 12/17/17 0423  TROPONINI <0.03 <0.03   BNP: Invalid input(s): POCBNP CBG: Recent Labs  Lab 12/19/17 1842 12/19/17 2141 12/20/17 0723 12/20/17 0848 12/20/17 1038  GLUCAP 385* 176* 65 69 250*   D-Dimer No results for input(s): DDIMER in the last 72 hours. Hgb A1c No results for input(s): HGBA1C in the last 72 hours. Lipid Profile No results for input(s): CHOL, HDL, LDLCALC, TRIG, CHOLHDL, LDLDIRECT in the last 72 hours. Thyroid function studies No results for input(s): TSH, T4TOTAL, T3FREE, THYROIDAB in the last 72 hours.  Invalid input(s): FREET3  Anemia work up No results for input(s): VITAMINB12, FOLATE,  FERRITIN, TIBC, IRON, RETICCTPCT in the last 72 hours. Urinalysis    Component Value Date/Time   COLORURINE STRAW (A) 12/18/2017 1700   APPEARANCEUR CLEAR 12/18/2017 1700   LABSPEC 1.010 12/18/2017 1700   PHURINE 6.0 12/18/2017 1700   GLUCOSEU >=500 (A) 12/18/2017 1700   HGBUR NEGATIVE 12/18/2017 1700   BILIRUBINUR NEGATIVE 12/18/2017 1700   KETONESUR NEGATIVE 12/18/2017 1700   PROTEINUR NEGATIVE 12/18/2017 1700   NITRITE NEGATIVE 12/18/2017 1700   LEUKOCYTESUR NEGATIVE 12/18/2017 1700   Sepsis Labs Invalid input(s): PROCALCITONIN,  WBC,  LACTICIDVEN Microbiology Recent Results (from the past 240 hour(s))  MRSA PCR Screening     Status: None   Collection Time: 12/17/17  1:50 PM  Result Value Ref Range Status   MRSA by PCR NEGATIVE NEGATIVE Final    Comment:        The GeneXpert MRSA Assay (FDA approved for NASAL specimens only), is one component of a comprehensive MRSA colonization surveillance program. It is not intended to diagnose MRSA infection nor to guide or monitor treatment for MRSA infections. Performed at [facility], [address] [place], [place] [address]      Time coordinating discharge: Over 30 minutes  SIGNED:   Calvert Cantor, MD  [facility] 12/20/2017, 2:46 PM Pager   If 7PM-7AM, please contact night-coverage [email or web address] Password [email or web address]

## 2017-12-19 NOTE — Progress Notes (Signed)
Inpatient Diabetes Program Recommendations  AACE/ADA: New Consensus Statement on Inpatient Glycemic Control (2015)  Target Ranges:  Prepandial:   less than 140 mg/dL      Peak postprandial:   less than 180 mg/dL (1-2 hours)      Critically ill patients:  140 - 180 mg/dL   Review of Glycemic Control  Diabetes history:DM 2 Outpatient Diabetes medications:Levemir 15 units qam, 25 units qpm, Novolog 4 units tid Current orders for Inpatient glycemic control:Levemir 12 units Daily, Levemir 20 units qpm, Novolog Moderate Correction 0-15 units tid  Inpatient Diabetes Program Recommendations:    Noted Levemir 12 units ordered for daily in addition to pm dose. Pt may still need meal coverage as postprandial glucose levels increase significantly. Consider Novolog 3-5 units tid.  Thanks,  Christena DeemShannon Caeson Filippi RN, MSN, BC-ADM, [facility] Inpatient Diabetes Coordinator Team Pager [phone] (8a-5p)

## 2017-12-19 NOTE — Progress Notes (Deleted)
PROGRESS NOTE    Peggy Maceresa Strieter   WUJ:[id number]  DOB: 08/14/1951  DOA: 12/16/2017 PCP: Emi BelfastGessner, Deborah B, FNP   Brief Narrative:  Peggy Todd is a 67 y.o. female with medical history significant for ILD-pulmonary fibrosis, history of PE and DVT, hypertension, hypothyroidism, DM 2, CAD.  Patient presented to the ED with complaints of worsening shortness of breath over the past 36 hours. Placed on a non-rebreather mask at 10 L. Started on Lasix with the suspicion for fluid overload. Weaned down to 6 L O2.    Subjective: Sleepy this AM- received Ambien at 2:45 AM ROS: no complaints of nausea, vomiting, constipation diarrhea, or dysuria. No other complaints.   Assessment & Plan:   Principal Problem:   Acute on chronic respiratory failure with hypoxia / severe ILD -  Currently on diuretics for possibility of fluid overload- BNP was 170-   -  have called pulmonary for opinion as well- she is back on 5 L O2 but when she ambulates pulse ox drops to 70% on 4 L per RN note - she tells me she cannot afford her Cellcept and stopped it about 3 days ago- she aws obtaining it under a grant from [employer] - case management [facility] to [facility] - cont chronic prednisone  - ECHO  - EF is vigorous but diastolic function cannot be assessed- she has mod to severe RV failure-  still becoming hypoxic - will obtain CT chest as she has a h/o PE  Active Problems:     History of pulmonary embolism - treated and taken off of anticoagulation    Hypothyroidism - cont synthroid     Type 2 diabetes mellitus with neurologic complication, with long-term current use of insulin - episode of hypoglycemia yesterday - 3/21- sugars now quite elevated- will resumed AM Levemir     CAD - Plavix  DVT prophylaxis: Lovenox Code Status: DNR Family Communication:  Disposition Plan: cont to follow resp status Consultants:   PCCM Procedures:    2 D ECHO -  Left ventricle: The cavity size was normal. Wall thickness was   normal. Systolic function was vigorous. The estimated ejection   fraction was in the range of 65% to 70%. The study is not   technically sufficient to allow evaluation of LV diastolic   function. - Right ventricle: The cavity size was moderately dilated. Systolic   function was moderately to severely reduced. - Pulmonary arteries: PA peak pressure: 38 mm Hg (S). - Pericardium, extracardiac: A trivial pericardial effusion was   identified. Antimicrobials:  Anti-infectives (From admission, onward)   None       Objective: Vitals:   12/19/17 0748 12/19/17 0800 12/19/17 0900 12/19/17 1225  BP: 118/61   129/70  Pulse: 94 97 (!) 101 97  Resp: (!) 26 (!) 26 (!) 23 (!) 24  Temp: 98.3 F (36.8 C)   98.3 F (36.8 C)  TempSrc: Oral   Oral  SpO2: 92% (!) 88% 97% 98%  Weight:      Height:        Intake/Output Summary (Last 24 hours) at 12/19/2017 1422 Last data filed at 12/19/2017 1325 Gross per 24 hour  Intake 1080 ml  Output 1350 ml  Net -270 ml   Filed Weights   12/17/17 1710 12/18/17 0509 12/19/17 0500  Weight: 61.5 kg (135 lb 9.3 oz) 61.5 kg (135 lb 9.3 oz) 59.2 kg (130 lb 8.2 oz)  Examination: General exam: Appears comfortable  HEENT: PERRLA, oral mucosa moist, no sclera icterus or thrush Respiratory system: still with bilateral crackles up to mid lung fields - dry cough noted - Respiratory effort normal. Cardiovascular system: S1 & S2 heard, RRR.  No murmurs  Gastrointestinal system: Abdomen soft, non-tender, nondistended. Normal bowel sound. No organomegaly Central nervous system: Alert and oriented. No focal neurological deficits. Extremities: No cyanosis, clubbing or edema Skin: No rashes or ulcers Psychiatry:  Mood & affect appropriate.     Data Reviewed: I have personally reviewed following labs and imaging studies  CBC: Recent Labs  Lab 12/16/17 1449 12/17/17 0423  WBC 14.1* 12.8*  HGB  13.8 13.1  HCT 45.5 41.9  MCV 92.9 91.9  PLT 270 232   Basic Metabolic Panel: Recent Labs  Lab 12/16/17 1737 12/17/17 0423 12/18/17 0336 12/18/17 1351  NA 135 141 141 137  K 4.9 3.7 4.6 4.6  CL 88* 91* 92* 91*  CO2 33* 36* 33* 31  GLUCOSE 525* 101* 231* 424*  BUN 26* 18 26* 29*  CREATININE 1.07* 0.90 0.94 1.18*  CALCIUM 9.6 9.5 9.7 9.2   GFR: Estimated Creatinine Clearance: 37.8 mL/min (A) (by C-G formula based on SCr of 1.18 mg/dL (H)). Liver Function Tests: No results for input(s): AST, ALT, ALKPHOS, BILITOT, PROT, ALBUMIN in the last 168 hours. No results for input(s): LIPASE, AMYLASE in the last 168 hours. No results for input(s): AMMONIA in the last 168 hours. Coagulation Profile: No results for input(s): INR, PROTIME in the last 168 hours. Cardiac Enzymes: Recent Labs  Lab 12/16/17 2350 12/17/17 0423  TROPONINI <0.03 <0.03   BNP (last 3 results) Recent Labs    10/06/17 0935  PROBNP 207.0*   HbA1C: No results for input(s): HGBA1C in the last 72 hours. CBG: Recent Labs  Lab 12/18/17 1225 12/18/17 1617 12/18/17 2109 12/19/17 0746 12/19/17 1224  GLUCAP 553* 283* 392* 112* 387*   Lipid Profile: No results for input(s): CHOL, HDL, LDLCALC, TRIG, CHOLHDL, LDLDIRECT in the last 72 hours. Thyroid Function Tests: No results for input(s): TSH, T4TOTAL, FREET4, T3FREE, THYROIDAB in the last 72 hours. Anemia Panel: No results for input(s): VITAMINB12, FOLATE, FERRITIN, TIBC, IRON, RETICCTPCT in the last 72 hours. Urine analysis:    Component Value Date/Time   COLORURINE STRAW (A) 12/18/2017 1700   APPEARANCEUR CLEAR 12/18/2017 1700   LABSPEC 1.010 12/18/2017 1700   PHURINE 6.0 12/18/2017 1700   GLUCOSEU >=500 (A) 12/18/2017 1700   HGBUR NEGATIVE 12/18/2017 1700   BILIRUBINUR NEGATIVE 12/18/2017 1700   KETONESUR NEGATIVE 12/18/2017 1700   PROTEINUR NEGATIVE 12/18/2017 1700   NITRITE NEGATIVE 12/18/2017 1700   LEUKOCYTESUR NEGATIVE 12/18/2017 1700    Sepsis Labs: @LABRCNTIP (procalcitonin:4,lacticidven:4) ) Recent Results (from the past 240 hour(s))  MRSA PCR Screening     Status: None   Collection Time: 12/17/17  1:50 PM  Result Value Ref Range Status   MRSA by PCR NEGATIVE NEGATIVE Final    Comment:        The GeneXpert MRSA Assay (FDA approved for NASAL specimens only), is one component of a comprehensive MRSA colonization surveillance program. It is not intended to diagnose MRSA infection nor to guide or monitor treatment for MRSA infections. Performed at [facility], [address], [place], [place] [address]          Radiology Studies: Dg Chest Port 1 View  Result Date: 12/19/2017 CLINICAL DATA:  Hypoxia EXAM: PORTABLE CHEST 1 VIEW COMPARISON:  12/16/2017 FINDINGS:  Cardiac shadow is again enlarged. Coronary stenting is again seen. Diffuse interstitial changes are again identified bilaterally stable from the prior exam. No new focal confluent infiltrate is seen. IMPRESSION: Stable interstitial changes bilaterally. Electronically Signed   By: Alcide Clever M.D.   On: 12/19/2017 07:51      Scheduled Meds: . clopidogrel  75 mg Oral Daily  . dextromethorphan-guaiFENesin  1 tablet Oral BID  . enoxaparin (LOVENOX) injection  40 mg Subcutaneous Daily  . feeding supplement (GLUCERNA SHAKE)  237 mL Oral BID BM  . furosemide  60 mg Oral Daily  . Influenza vac split quadrivalent PF  0.5 mL Intramuscular Tomorrow-1000  . insulin aspart  0-15 Units Subcutaneous TID WC  . insulin aspart  14 Units Subcutaneous Once  . insulin aspart  4 Units Subcutaneous TID WC  . insulin detemir  12 Units Subcutaneous Daily  . insulin detemir  20 Units Subcutaneous QHS  . ipratropium-albuterol  3 mL Nebulization BID  . mouth rinse  15 mL Mouth Rinse BID  . mycophenolate  500 mg Oral BID  . pantoprazole  40 mg Oral BID AC  . potassium chloride SA  20 mEq Oral Daily  . predniSONE  20 mg Oral Q breakfast  . rosuvastatin  20 mg  Oral Daily  . sertraline  25 mg Oral Daily   Continuous Infusions:   LOS: 3 days    Time spent in minutes: 35    Calvert Cantor, MD [facility] Pager: [email or web address] Password [facility] 12/19/2017, 2:22 PM

## 2017-12-19 NOTE — Progress Notes (Signed)
Pts CBG showing over 600. Rechecked and 582. Pt did miss noon insulin coverage. Gave meal time coverage plus 15 units sliding scale and ordered stat BMP. Relayed to attending MD

## 2017-12-19 NOTE — Progress Notes (Signed)
Messaged attending MD re:"Pt very lethargic today, arousable when speaking loudly.  Dtr here and states this is not unusual for pt. No urine output this shift ,bladder scan:724. Do we want to straight cath?"

## 2017-12-19 NOTE — Progress Notes (Signed)
Pts CBG now 385. Pt did have another episode of emesis abt an hr ago while starting to eat dinner. BM today, abd nontender. All relayed to attending MD

## 2017-12-20 LAB — GLUCOSE, CAPILLARY
Glucose-Capillary: 65 mg/dL (ref 65–99)
Glucose-Capillary: 69 mg/dL (ref 65–99)
Glucose-Capillary: 250 mg/dL — ABNORMAL HIGH (ref 65–99)

## 2017-12-20 LAB — BASIC METABOLIC PANEL
ANION GAP: 14 (ref 5–15)
BUN: 21 mg/dL — AB (ref 6–20)
CHLORIDE: 93 mmol/L — AB (ref 101–111)
CO2: 31 mmol/L (ref 22–32)
Calcium: 9.3 mg/dL (ref 8.9–10.3)
Creatinine, Ser: 0.93 mg/dL (ref 0.44–1.00)
GFR calc Af Amer: >60 mL/min (ref >60)
GFR calc non Af Amer: >60 mL/min (ref >60)
GLUCOSE: 148 mg/dL — AB (ref 65–99)
POTASSIUM: 4 mmol/L (ref 3.5–5.1)
Sodium: 138 mmol/L (ref 135–145)

## 2017-12-20 MED ORDER — TRAZODONE HCL 50 MG PO TABS
50.0000 mg | ORAL_TABLET | Freq: Once | ORAL | Status: AC
  Administered 2017-12-20: 50 mg via ORAL
  Filled 2017-12-20: qty 1

## 2017-12-20 MED ORDER — DEXTROSE 50 % IV SOLN
12.5000 mL | Freq: Once | INTRAVENOUS | Status: AC
  Administered 2017-12-20: 12.5 mL via INTRAVENOUS

## 2017-12-20 MED ORDER — DEXTROSE 50 % IV SOLN
INTRAVENOUS | Status: AC
  Filled 2017-12-20: qty 50

## 2017-12-20 NOTE — Progress Notes (Signed)
[facility]  Patient did not go home yesterday as she vomited a small amount after lunch and dinner. Today she has had breakfast without issues. She is going to be discharged today. See my d/c summary from yesterday which I have updated.   Peggy CantorSaima Alastair Hennes, MD

## 2017-12-20 NOTE — Progress Notes (Signed)
Pt's CBG noted to be 65 on 0800 collection this am. Pt AAOx4 but drowsy with stable vital signs. Pt. Drank 8oz of orange juice. Recheck at 0830 CBG was still 69. Orders for 1/2 amp of D50 given by Dr. Alphonsa Giniswan of [facility]. CBG at 1030 was 250. Will continue to monitor.

## 2017-12-23 ENCOUNTER — Telehealth: Payer: Self-pay

## 2017-12-23 LAB — GLUCOSE, CAPILLARY: Glucose-Capillary: >600 mg/dL (ref 65–99)

## 2017-12-23 NOTE — Telephone Encounter (Signed)
Please call patient and make her an appointment for follow up in 2-4 weeks.

## 2017-12-23 NOTE — Telephone Encounter (Signed)
Transition Care Management Follow-up Telephone Call  Admit date: 12/16/2017 Discharge date: 12/20/2017 Principal Problem:   Acute on chronic respiratory failure with hypoxia    How have you been since you were released from the hospital? "I feel really good"   Do you understand why you were in the hospital? yes   Do you understand the discharge instructions? yes   Where were you discharged to? Home. Has caregiver [name]([place])   Items Reviewed:  Medications reviewed: yes  Allergies reviewed: yes  Dietary changes reviewed: yes  Referrals reviewed: yes   Functional Questionnaire:   Activities of Daily Living (ADLs):   She states they are independent in the following: ambulation, bathing and hygiene, feeding, continence, grooming, toileting and dressing. Caregiver with pt during ADLs to prevent falls.  States they require assistance with the following: None.    Any transportation issues/concerns?: no   Any patient concerns? no   Confirmed importance and date/time of follow-up visits scheduled yes  Patient declines f/u appt with PCP. Encouraged to call with issues.   Confirmed with patient if condition begins to worsen call PCP or go to the ER.  Patient was given the office number and encouraged to call back with question or concerns.  : yes

## 2017-12-24 ENCOUNTER — Other Ambulatory Visit: Payer: Self-pay | Admitting: Internal Medicine

## 2017-12-24 NOTE — Telephone Encounter (Signed)
Please advise if okay with refilling this We just sent #[phone] on 12/08/17

## 2017-12-25 NOTE — Telephone Encounter (Signed)
Noted.  Spoke with patient and appointment scheduled for this Monday, 12/29/17 at 1100.  This is the only day that she has transportation.  Overall, patient states she is doing "okay".    Debbie, I know this is a little sooner than you requested, is this time okay or would you like for me to push her back some?  She is limited in scheduling due to transportation difficulties but knows she can work this out for Monday.

## 2017-12-26 NOTE — Telephone Encounter (Signed)
That is perfect, I'd rather see her sooner than later.

## 2017-12-29 ENCOUNTER — Ambulatory Visit (INDEPENDENT_AMBULATORY_CARE_PROVIDER_SITE_OTHER): Payer: Medicare Other | Admitting: Family Medicine

## 2017-12-29 ENCOUNTER — Encounter: Payer: Self-pay | Admitting: Family Medicine

## 2017-12-29 ENCOUNTER — Inpatient Hospital Stay: Payer: Medicare Other | Admitting: Family Medicine

## 2017-12-29 VITALS — BP 132/78 | HR 118 | Temp 97.9°F | Wt 136.8 lb

## 2017-12-29 DIAGNOSIS — E1165 Type 2 diabetes mellitus with hyperglycemia: Secondary | ICD-10-CM

## 2017-12-29 DIAGNOSIS — J849 Interstitial pulmonary disease, unspecified: Secondary | ICD-10-CM | POA: Diagnosis not present

## 2017-12-29 DIAGNOSIS — I2510 Atherosclerotic heart disease of native coronary artery without angina pectoris: Secondary | ICD-10-CM | POA: Diagnosis not present

## 2017-12-29 DIAGNOSIS — E1140 Type 2 diabetes mellitus with diabetic neuropathy, unspecified: Secondary | ICD-10-CM

## 2017-12-29 DIAGNOSIS — E162 Hypoglycemia, unspecified: Secondary | ICD-10-CM

## 2017-12-29 DIAGNOSIS — Z794 Long term (current) use of insulin: Secondary | ICD-10-CM | POA: Diagnosis not present

## 2017-12-29 DIAGNOSIS — R0902 Hypoxemia: Secondary | ICD-10-CM | POA: Diagnosis not present

## 2017-12-29 DIAGNOSIS — M5442 Lumbago with sciatica, left side: Secondary | ICD-10-CM

## 2017-12-29 MED ORDER — OXYCODONE-ACETAMINOPHEN 5-325 MG PO TABS: 1.0000 | ORAL_TABLET | Freq: Three times a day (TID) | ORAL | 0 refills | Status: AC | PRN

## 2017-12-29 NOTE — Patient Instructions (Addendum)
   Please take Mucinex twice a day  Take a daily generic long acting antihistamine like cetirizine or loratadine.   If phlegm production not improved in 2-3 days, please let me know  Please take your insulin detemir (levemir) differently- 25 units in the morning and 15 units at night  I have put in an urgent referral to endocrinology

## 2017-12-29 NOTE — Progress Notes (Signed)
Subjective:    Patient ID: Peggy Todd, female    DOB: 10/16/1950, 67 y.o.   MRN: [id number]  HPI This is a 67 yo female who presents today for hospital follow up. Was admitted 3/19- 23/19 for acute on chronic respiratory failure with hypoxia. She is accompanied by her daughter's significant other.   Pulm- Has had increased sputum x 7 days, worse in morning, yellow- green, taking mucinex 1x/ day and using Ayr twice a day. Feels like something in throat. No fever or chills. No change in oxygenation- sats drop to 50s with activity and return to low 90s with rest. Currently on prednisone 20 mg daily. Feels a little better this morning. Had some laryngitis a couple of days ago.   DM type 2- Has noticed blood sugars low in morning. Was 412 last night and 48 this morning, 45 yesterday morning. Appetite good, eating 3 meals a day. It was noted to be very erratic in hospital. Has felt more shaky.   Fall/back pain- Had a fall recently, fell back landing on buttocks and back, no head injury. Thinks related to low blood sugar. Able to move all extremities, ambulate as before, pain at low back, not relieved with tramadol (she takes 3/x day for cough suppression). Requests something stronger. Following fall, she is not getting out of bed without having her blood sugar checked and someone helps her.   Palliative care- discussed chronic nature of patient's lung disease and asked if she would be interested in talking with someone at palliative care and she said she is. Will put in consult.   Potassium supplementation- difficult for her to swallow pills, even cut in half. Per hospital note, she can stop potassium supplementation, never dropped potassium with diuresis.     Past Medical History:  Diagnosis Date  . Asthma   . Coronary artery disease   . Depression   . Diabetic peripheral angiopathy (HCC)   . DVT (deep venous thrombosis) (HCC) ?2015   LLE  . Dvt femoral (deep venous thrombosis) (HCC) ?2015     LLE  . GERD (gastroesophageal reflux disease)    "had GERD OR" (12/17/2017)  . High cholesterol   . History of stomach ulcers   . Hypertension   . Migraine    "none since periods stopped" (12/17/2017)  . On home oxygen therapy "   "5L; 24/7" (12/17/2017)  . Pneumonia 2017  . Pulmonary embolism (HCC) ?2015  . Type II diabetes mellitus (HCC)    Past Surgical History:  Procedure Laterality Date  . APPENDECTOMY    . BACK SURGERY    . CORONARY ANGIOPLASTY WITH STENT PLACEMENT  12/2016   "4 stents"  . DILATION AND CURETTAGE OF UTERUS    . ESOPHAGOGASTRODUODENOSCOPY (EGD) WITH ESOPHAGEAL DILATION  X 2  . HIATAL HERNIA REPAIR  2010   [place]  . LAPAROSCOPIC CHOLECYSTECTOMY    . LUMBAR DISC SURGERY    . LUNG BIOPSY  2017  . TONSILLECTOMY    . TOTAL ABDOMINAL HYSTERECTOMY    . TUBAL LIGATION     Family History  Problem Relation Age of Onset  . Heart disease Mother   . Stroke Mother   . Heart disease Father    Social History   Tobacco Use  . Smoking status: Former Smoker    Packs/day: 0.12    Years: 10.00    Pack years: 1.20    Types: Cigarettes    Last attempt to quit: 1990  Years since quitting: 29.2  . Smokeless tobacco: Never Used  Substance Use Topics  . Alcohol use: No  . Drug use: No    Review of Systems Per HPI    Objective:   Physical Exam  Constitutional: She is oriented to person, place, and time. She appears ill. No distress.  Sitting in wheelchair.    HENT:  Head: Normocephalic and atraumatic.  Mouth/Throat: Oropharynx is clear and moist.  Eyes: Conjunctivae are normal.  Cardiovascular: Normal rate, regular rhythm and normal heart sounds.  Pulmonary/Chest: No respiratory distress. She has no wheezes.  Dyspneic with little activity- standing, leaning forward.  Scattered coarse breath sounds bilaterally posteriorly.   Musculoskeletal: She exhibits no edema.  Resolving ecchymosis over left buttock and left thoracic back.  Able to stand.   Neurological: She is alert and oriented to person, place, and time.  Skin: Skin is warm and dry.  Psychiatric: She has a normal mood and affect. Her behavior is normal. Judgment and thought content normal.  Vitals reviewed.     BP 132/78   Pulse (!) 118   Temp 97.9 F (36.6 C) (Oral)   Wt 136 lb 12 oz (62 kg)   SpO2 90%   BMI 26.71 kg/m  Wt Readings from Last 3 Encounters:  12/29/17 136 lb 12 oz (62 kg)  12/19/17 130 lb 8.2 oz (59.2 kg)  11/10/17 140 lb (63.5 kg)       Assessment & Plan:  1. Type 2 diabetes mellitus with diabetic neuropathy, with long-term current use of insulin (HCC) - blood sugars difficult to control, episodes of hypoglycemia, recent increase in prednisone.  - Ambulatory referral to Endocrinology  2. Hypoglycemia - Will decrease levemir insulin from 25 units at bedtime to 15 units and take 25 units in the morning. - continue to monitor blood sugar readings and report any low readings - Ambulatory referral to Endocrinology  3. Type 2 diabetes mellitus with hyperglycemia, with long-term current use of insulin (HCC) - Ambulatory referral to Endocrinology  4. ILD (interstitial lung disease) (HCC) - recent increased sputum, no wheezes on exam today, will increase Mucinex to BID, add long acting antihistamine, caregiver will let me know if not improved in 2-3 days - discussed progressive, incurable nature of her illness and she and caregiver were open to referral - Amb Referral to Palliative Care  5. Hypoxemia - Amb Referral to Palliative Care  6. Coronary artery disease involving native coronary artery of native heart without angina pectoris - Amb Referral to Palliative Care  7. Acute midline low back pain with left sided sciatica - Percocet 5-325 #15, instructed to not take with tramadol, to only use for severe pain  Olean Ree, FNP-BC  [facility] at [facility], [facility]  12/29/2017 1:18 PM

## 2018-01-03 ENCOUNTER — Other Ambulatory Visit: Payer: Self-pay

## 2018-01-03 ENCOUNTER — Emergency Department (HOSPITAL_COMMUNITY): Payer: Medicare Other

## 2018-01-03 ENCOUNTER — Encounter (HOSPITAL_COMMUNITY): Payer: Self-pay | Admitting: Emergency Medicine

## 2018-01-03 ENCOUNTER — Emergency Department (HOSPITAL_COMMUNITY)
Admission: EM | Admit: 2018-01-03 | Discharge: 2018-01-03 | Disposition: A | Discharge status: Home / Self Care | Payer: Medicare Other | Attending: Emergency Medicine | Admitting: Emergency Medicine

## 2018-01-03 DIAGNOSIS — R0609 Other forms of dyspnea: Secondary | ICD-10-CM

## 2018-01-03 DIAGNOSIS — R39198 Other difficulties with micturition: Secondary | ICD-10-CM | POA: Diagnosis not present

## 2018-01-03 DIAGNOSIS — R300 Dysuria: Secondary | ICD-10-CM | POA: Insufficient documentation

## 2018-01-03 DIAGNOSIS — M25551 Pain in right hip: Secondary | ICD-10-CM | POA: Diagnosis present

## 2018-01-03 DIAGNOSIS — E039 Hypothyroidism, unspecified: Secondary | ICD-10-CM | POA: Diagnosis not present

## 2018-01-03 DIAGNOSIS — R1030 Lower abdominal pain, unspecified: Secondary | ICD-10-CM | POA: Insufficient documentation

## 2018-01-03 DIAGNOSIS — Z794 Long term (current) use of insulin: Secondary | ICD-10-CM | POA: Insufficient documentation

## 2018-01-03 DIAGNOSIS — E1140 Type 2 diabetes mellitus with diabetic neuropathy, unspecified: Secondary | ICD-10-CM | POA: Diagnosis not present

## 2018-01-03 DIAGNOSIS — J45909 Unspecified asthma, uncomplicated: Secondary | ICD-10-CM | POA: Insufficient documentation

## 2018-01-03 DIAGNOSIS — W010XXA Fall on same level from slipping, tripping and stumbling without subsequent striking against object, initial encounter: Secondary | ICD-10-CM | POA: Insufficient documentation

## 2018-01-03 DIAGNOSIS — Z87891 Personal history of nicotine dependence: Secondary | ICD-10-CM | POA: Diagnosis not present

## 2018-01-03 DIAGNOSIS — R000 Tachycardia, unspecified: Secondary | ICD-10-CM | POA: Insufficient documentation

## 2018-01-03 DIAGNOSIS — I10 Essential (primary) hypertension: Secondary | ICD-10-CM | POA: Diagnosis not present

## 2018-01-03 DIAGNOSIS — I2510 Atherosclerotic heart disease of native coronary artery without angina pectoris: Secondary | ICD-10-CM | POA: Insufficient documentation

## 2018-01-03 DIAGNOSIS — M545 Low back pain: Secondary | ICD-10-CM | POA: Insufficient documentation

## 2018-01-03 DIAGNOSIS — W19XXXD Unspecified fall, subsequent encounter: Secondary | ICD-10-CM

## 2018-01-03 LAB — CBC WITH DIFFERENTIAL/PLATELET
BASOS PCT: 0 %
Basophils Absolute: 0 10*3/uL (ref 0.0–0.1)
EOS ABS: 0.1 10*3/uL (ref 0.0–0.7)
Eosinophils Relative: 1 %
HCT: 43.2 % (ref 36.0–46.0)
HEMOGLOBIN: 13.5 g/dL (ref 12.0–15.0)
Lymphocytes Relative: 6 %
Lymphs Abs: 0.9 10*3/uL (ref 0.7–4.0)
MCH: 29.2 pg (ref 26.0–34.0)
MCHC: 31.3 g/dL (ref 30.0–36.0)
MCV: 93.3 fL (ref 78.0–100.0)
MONO ABS: 0.6 10*3/uL (ref 0.1–1.0)
MONOS PCT: 4 %
NEUTROS PCT: 89 %
Neutro Abs: 13.7 10*3/uL — ABNORMAL HIGH (ref 1.7–7.7)
Platelets: 208 10*3/uL (ref 150–400)
RBC: 4.63 MIL/uL (ref 3.87–5.11)
RDW: 14.4 % (ref 11.5–15.5)
WBC: 15.3 10*3/uL — ABNORMAL HIGH (ref 4.0–10.5)

## 2018-01-03 LAB — BASIC METABOLIC PANEL
Anion gap: 12 (ref 5–15)
BUN: 15 mg/dL (ref 6–20)
CO2: 34 mmol/L — ABNORMAL HIGH (ref 22–32)
CREATININE: 0.94 mg/dL (ref 0.44–1.00)
Calcium: 9.2 mg/dL (ref 8.9–10.3)
Chloride: 92 mmol/L — ABNORMAL LOW (ref 101–111)
GFR calc non Af Amer: >60 mL/min (ref >60)
Glucose, Bld: 446 mg/dL — ABNORMAL HIGH (ref 65–99)
Potassium: 4.4 mmol/L (ref 3.5–5.1)
SODIUM: 138 mmol/L (ref 135–145)

## 2018-01-03 LAB — URINALYSIS, ROUTINE W REFLEX MICROSCOPIC
Bacteria, UA: NONE SEEN
Bilirubin Urine: NEGATIVE
Glucose, UA: >=500 mg/dL — AB
Hgb urine dipstick: NEGATIVE
KETONES UR: NEGATIVE mg/dL
Leukocytes, UA: NEGATIVE
Nitrite: NEGATIVE
PH: 7 (ref 5.0–8.0)
PROTEIN: NEGATIVE mg/dL
Specific Gravity, Urine: 1.006 (ref 1.005–1.030)

## 2018-01-03 LAB — CBG MONITORING, ED: GLUCOSE-CAPILLARY: 195 mg/dL — AB (ref 65–99)

## 2018-01-03 MED ORDER — FENTANYL CITRATE (PF) 100 MCG/2ML IJ SOLN
25.0000 ug | Freq: Once | INTRAMUSCULAR | Status: AC
  Administered 2018-01-03: 25 ug via INTRAVENOUS
  Filled 2018-01-03: qty 2

## 2018-01-03 MED ORDER — OXYCODONE-ACETAMINOPHEN 5-325 MG PO TABS
1.0000 | ORAL_TABLET | Freq: Once | ORAL | Status: AC
  Administered 2018-01-03: 1 via ORAL
  Filled 2018-01-03: qty 1

## 2018-01-03 NOTE — ED Provider Notes (Signed)
[facility] Provider Note   CSN: [id number] Arrival date & time: 01/03/18  1022     History   Chief Complaint Chief Complaint  Patient presents with  . Fall  . Hip Pain    right    HPI Peggy Todd is a 67 y.o. female with h/o ILD, PE and DVT, HTN, DM 2 with neuropathy, CAD here for evaluation of worsening right hip and low back pain since fall 10 days ago. Reports falling on her buttocks due to drop of blood sugar. Saw PCP on 4/1 for this and given percocet but has only taken 2-3 at home because it makes her very somnolent and she is afraid of falling. Lives with caretaker and daughter but alone for 8-10 hours at home.  Reports pain has significantly worsened and has been put weight on her right leg due to pain in hip and low back, unable to walk at baseline at home. Typically walks with walker and one person assist, care giver at bedside states she is having to lift her and help her more. Has also tried tylenol with no relief. Also reports dysuria and lower abdominal pain x 2-3 days. Has been having to strain for urine and BM since fall, but denies frank bladder or bowel incontinence or retention. She has no saddle anesthesia, weakness to extremities, fevers, nausea, vomiting. Chronic neuropathy to feet, unchanged. Chronic oxygen therapy for ILD, gradual decline recently, chronic cough unchanged.  HPI  Past Medical History:  Diagnosis Date  . Asthma   . Coronary artery disease   . Depression   . Diabetic peripheral angiopathy (HCC)   . DVT (deep venous thrombosis) (HCC) ?2015   LLE  . Dvt femoral (deep venous thrombosis) (HCC) ?2015   LLE  . GERD (gastroesophageal reflux disease)    "had GERD OR" (12/17/2017)  . High cholesterol   . History of stomach ulcers   . Hypertension   . Migraine    "none since periods stopped" (12/17/2017)  . On home oxygen therapy "   "5L; 24/7" (12/17/2017)  . Pneumonia 2017  . Pulmonary embolism (HCC) ?2015    . Type II diabetes mellitus [facility](HCC)     Patient Active Problem List   Diagnosis Date Noted  . Acute on chronic respiratory failure with hypoxia (HCC) 12/17/2017  . E. coli UTI (urinary tract infection) 07/10/2017  . Enterococcus UTI 07/10/2017  . Hypokalemia 07/08/2017  . Hyperglycemia   . Upper airway cough syndrome 06/28/2017  . DOE (dyspnea on exertion) 06/27/2017  . Chronic respiratory failure with hypoxia (HCC) 06/27/2017  . Degeneration of lumbar intervertebral disc 05/06/2017  . CAD (coronary artery disease) 12/12/2016  . ILD (interstitial lung disease) (HCC) 11/06/2016  . Hypoxemia 11/06/2016  . 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 Acute respiratory failure with hypoxia (HCC) 12/26/2015  . Opioid overdose (HCC) 12/26/2015  . Acute hypoxemic respiratory failure (HCC) 10/20/2015  . Anxiety and depression 10/20/2015  . Gastroesophageal reflux disease without esophagitis 10/20/2015  . Iron deficiency anemia 10/20/2015  . Pulmonary edema 10/20/2015  . Herniated nucleus pulposus, L4-5 left 08/08/2015  . Chronic anticoagulation 07/26/2015  . Dyslipidemia 07/26/2015  . Leg DVT (deep venous thromboembolism), chronic, left (HCC) 03/03/2013  . History of pulmonary embolism 02/28/2013  . Spinal stenosis of lumbar region without neurogenic claudication 08/25/2012  . Acquired spondylolisthesis 07/22/2012  . Idiopathic peripheral neuropathy 07/09/2012  . Essential hypertension 06/06/2011  . Hypothyroidism 06/06/2011  . Type 2 diabetes mellitus with  neurologic complication, with long-term current use of insulin (HCC) 06/06/2011    Past Surgical History:  Procedure Laterality Date  . APPENDECTOMY    . BACK SURGERY    . CORONARY ANGIOPLASTY WITH STENT PLACEMENT  12/2016   "4 stents"  . DILATION AND CURETTAGE OF UTERUS    . ESOPHAGOGASTRODUODENOSCOPY (EGD) WITH ESOPHAGEAL DILATION  X 2  . HIATAL HERNIA REPAIR  2010   "GERD OR; done in [employer]  . LAPAROSCOPIC  CHOLECYSTECTOMY    . LUMBAR DISC SURGERY    . LUNG BIOPSY  2017  . TONSILLECTOMY    . TOTAL ABDOMINAL HYSTERECTOMY    . TUBAL LIGATION       OB History   None      Home Medications    Prior to Admission medications   Medication Sig Start Date End Date Taking? Authorizing Provider  acetaminophen (TYLENOL) 500 MG tablet Take 1,000 mg by mouth every 6 (six) hours as needed.    [provider]  albuterol (PROAIR HFA) 108 (90 Base) MCG/ACT inhaler Inhale 2 puffs into the lungs 4 (four) times daily as needed. 10/30/13   [provider]  carboxymethylcellulose (REFRESH PLUS) 0.5 % SOLN Place 1 drop into both eyes 3 (three) times daily as needed (dry eyes).    [provider]  clopidogrel (PLAVIX) 75 MG tablet Take 1 tablet (75 mg total) daily by mouth. 08/18/17   Emi Belfast, FNP  Dextromethorphan-Guaifenesin (DELSYM CGH/CHEST CONG DM CHILD) 5-100 MG/5ML LIQD Take 10 mLs by mouth at bedtime.    [provider]  furosemide (LASIX) 20 MG tablet Take 3 tablets (60 mg total) by mouth daily. 12/08/17   Emi Belfast, FNP  insulin aspart (NOVOLOG) 100 UNIT/ML injection Inject 4 Units into the skin 3 (three) times daily with meals. 07/12/17   Sheikh, Omair Latif, DO  Insulin Detemir (LEVEMIR) 100 UNIT/ML Pen Inject 15 units in AM and 25 units in PM Patient taking differently: Inject 15-25 Units into the skin See admin instructions. Inject 15 units in AM and 25 units in PM 08/18/17   Olean Ree B, FNP  KLOR-CON M20 20 MEQ tablet TAKE 1 TABLET BY MOUTH EVERY DAY 12/16/17   Parrett, Virgel Bouquet, NP  nitroGLYCERIN (NITROSTAT) 0.4 MG SL tablet Place 0.4 mg under the tongue every 5 (five) minutes as needed for chest pain.    [provider]  oxyCODONE-acetaminophen (PERCOCET/ROXICET) 5-325 MG tablet Take 1 tablet by mouth every 8 (eight) hours as needed for severe pain. 12/29/17   Emi Belfast, FNP  OXYGEN Place 6-10 L into the nose continuous.  6lpm with rest and 8-10 lpm with exertion    [provider]  pantoprazole (PROTONIX) 40 MG tablet TAKE 30- 60 MIN BEFORE YOUR FIRST AND LAST MEALS OF THE DAY Patient taking differently: TAKE 40mg  by mouth twice daily 12/15/17   Nyoka Cowden, MD  polyethylene glycol (MIRALAX / GLYCOLAX) packet Take 17 g by mouth daily as needed for mild constipation.     [provider]  predniSONE (DELTASONE) 10 MG tablet TAKE TWO TABLETS BY MOUTH DAILY WITH BREAKFAST. Patient taking differently: TAKE TWO TABLETS (20mg ) BY MOUTH DAILY WITH BREAKFAST. 12/16/17   Parrett, Virgel Bouquet, NP  pregabalin (LYRICA) 100 MG capsule Take 1 capsule (100 mg total) by mouth 2 (two) times daily. 10/20/17   Parrett, Virgel Bouquet, NP  rosuvastatin (CRESTOR) 20 MG tablet Take 20 mg by mouth daily.    [provider]  sertraline (ZOLOFT) 25 MG tablet Take 25 mg by mouth daily.    [provider]  sodium chloride (OCEAN) 0.65 % SOLN nasal spray Place 1 spray into both nostrils as needed for congestion.    [provider]  traMADol (ULTRAM) 50 MG tablet TAKE 1 TABLET EVERY 6 HOURS AS NEEDED FOR PAIN/COUGHING 12/24/17   Nyoka Cowden, MD    Family History Family History  Problem Relation Age of Onset  . Heart disease Mother   . Stroke Mother   . Heart disease Father     Social History Social History   Tobacco Use  . Smoking status: Former Smoker    Packs/day: 0.12    Years: 10.00    Pack years: 1.20    Types: Cigarettes    Last attempt to quit: 1990    Years since quitting: 29.2  . Smokeless tobacco: Never Used  Substance Use Topics  . Alcohol use: No  . Drug use: No     Allergies   Oxycodone; Penicillins; Codeine; Hydrochlorothiazide; and Atorvastatin   Review of Systems Review of Systems  Gastrointestinal: Positive for abdominal pain (suprapubic).  Genitourinary: Positive for dysuria.  Musculoskeletal: Positive for arthralgias, back pain and gait problem.  Skin:  Positive for color change.  All other systems reviewed and are negative.    Physical Exam Updated Vital Signs BP 112/78   Pulse 92   Temp 98.1 F (36.7 C)   Resp 18   Ht 5' (1.524 m)   Wt 61.7 kg (136 lb)   SpO2 98%   BMI 26.56 kg/m   Physical Exam  Constitutional: She is oriented to person, place, and time. She appears well-developed and well-nourished. No distress.  Looks uncomfortable.  HENT:  Head: Normocephalic and atraumatic.  Nose: Nose normal.  Mouth/Throat: No oropharyngeal exudate.  Moist mucous membranes   Eyes: Pupils are equal, round, and reactive to light. Conjunctivae and EOM are normal.  Neck: Normal range of motion.  Cervical spine: no midline c-spine tenderness or step offs. Full AROM of neck without pain.   Cardiovascular: Regular rhythm and intact distal pulses. Tachycardia present.  No murmur heard. Pulses:      Dorsalis pedis pulses are 2+ on the right side, and 2+ on the left side.  2+ DP and radial pulses bilaterally. No LE edema.   Pulmonary/Chest: Effort normal. No respiratory distress. She has rhonchi.  Coarse lung sounds to lower lobes bilaterally, cleared after forceful cough. On [place]. No tachypnea or hypoxia. Speaking in full sentences.   Abdominal: Soft. Bowel sounds are normal. There is tenderness in the suprapubic area.  No G/R/R. No CVA tenderness.   Musculoskeletal: Normal range of motion. She exhibits no deformity.       Right hip: She exhibits tenderness.       Lumbar back: She exhibits tenderness.  Thoracic spine: no midline tenderness or step offs. Resolving ecchymosis to right posterior rib cage without tenderness. Lumbar spine: midline tenderness. TTP to bilateral SI joints and sciatic notches. Pelvis: pain to lateral aspects of hips, pain w/ RIGHT hip ER/IR, positive RIGHT SLR.  Lumbar pain reproduced with bilateral hip ROM.   Neurological: She is alert and oriented to person, place, and time.  5/5 strength with hip flexion and  extension, bilaterally.  5/5 strength with knee flexion and extension, bilaterally.  5/5 strength with ankle dorsiflexion and plantar flexion, bilaterally.  Sensation to light touch intact in lower extremities.   Skin: Skin is warm and dry.  Capillary refill takes less than 2 seconds. Ecchymosis noted.  Resolving ecchymosis to left buttock  Psychiatric: She has a normal mood and affect. Her behavior is normal. Judgment and thought content normal.  Nursing note and vitals reviewed.    ED Treatments / Results  Labs (all labs ordered are listed, but only abnormal results are displayed) Labs Reviewed  URINALYSIS, ROUTINE W REFLEX MICROSCOPIC - Abnormal; Notable for the following components:      Result Value   Color, Urine STRAW (*)    Glucose, UA >=500 (*)    Squamous Epithelial / LPF 0-5 (*)    All other components within normal limits  CBC WITH DIFFERENTIAL/PLATELET - Abnormal; Notable for the following components:   WBC 15.3 (*)    Neutro Abs 13.7 (*)    All other components within normal limits  BASIC METABOLIC PANEL - Abnormal; Notable for the following components:   Chloride 92 (*)    CO2 34 (*)    Glucose, Bld 446 (*)    All other components within normal limits  CBG MONITORING, ED - Abnormal; Notable for the following components:   Glucose-Capillary 195 (*)    All other components within normal limits  URINE CULTURE    EKG None  Radiology Ct Lumbar Spine Wo Contrast  Result Date: 01/03/2018 CLINICAL DATA:  Larey Seat 2 days ago.  Back pain and hip pain. EXAM: CT LUMBAR SPINE WITHOUT CONTRAST TECHNIQUE: Multidetector CT imaging of the lumbar spine was performed without intravenous contrast administration. Multiplanar CT image reconstructions were also generated. COMPARISON:  None. FINDINGS: Segmentation: 5 lumbar type vertebral bodies. Alignment: 7 mm anterolisthesis at L4-5. Vertebrae: No fracture or primary bone lesion. Paraspinal and other soft tissues: IVC filter. Otherwise  no notable finding. Disc levels: T12-L1: Normal interspace. L1-2: Normal interspace. L2-3: Normal interspace. L3-4: Mild bulging of the disc. Mild facet degeneration right more than left. No stenosis. L4-5: Previous posterior decompression and partial left facetectomy. Anterolisthesis of 7 mm at this level. Disc degeneration worse on the left with vacuum phenomenon. There appears to be a left posterolateral to foraminal disc herniation. This would be quite likely to affect the left L4 nerve and also could affect the lower left-sided nerve roots in the lateral recess. L5-S1: Bulging of the disc. Mild facet osteoarthritis. No stenosis. Sacroiliac joints show minimal osteoarthritis per IMPRESSION: Previous left hemilaminectomy and partial facetectomy at L4-5. Anterolisthesis of 7 mm at that level. Disc degeneration more pronounced on the left with vacuum phenomenon. Left posterolateral to foraminal disc herniation, virtually certain to compress the exiting left L4 nerve, and with potential to affect the lower left-sided nerves as well. L3-4 and L5-S1. Mild, non-compressive disc bulges and mild facet osteoarthritis. Electronically Signed   By: Paulina Fusi M.D.   On: 01/03/2018 13:22   Ct Hip Right Wo Contrast  Result Date: 01/03/2018 CLINICAL DATA:  Fall 2 days prior to imaging. Right hip and back pain. EXAM: CT OF THE RIGHT HIP WITHOUT CONTRAST TECHNIQUE: Multidetector CT imaging of the right hip was performed according to the standard protocol. Multiplanar CT image reconstructions were also generated. COMPARISON:  Radiographs from 01/03/2018 FINDINGS: Bones/Joint/Cartilage No fracture identified. Faint sclerosis superiorly in the right femoral head could be an indicator of avascular necrosis but does not have a significant sclerotic margin, and occasionally such findings can be incidental. There is no collapse or contour abnormality. No joint effusion. There is chondral thinning posteriorly in the hip joint,  likely degenerative. Ligaments Suboptimally assessed by  CT. Muscles and Tendons Remote prior right hamstring avulsion. Soft tissues Atherosclerosis. Other: 5 mm anterolisthesis at L4-5 resulting in left greater than right foraminal impingement at this level. IMPRESSION: 1. No appreciable fracture. There is some mild sclerosis in the upper portion of the right femoral head which could be a subtle finding of avascular necrosis, but there is no hip effusion and no evidence of contour abnormality. 2. Left greater than right foraminal impingement at L4-5. See lumbar spine CT report for further assessment. 3. Remote prior right hamstring avulsion. Electronically Signed   By: Gaylyn Rong M.D.   On: 01/03/2018 13:27   Dg Hip Unilat  With Pelvis 2-3 Views Right  Result Date: 01/03/2018 CLINICAL DATA:  Fall December 30, 2017.  Right hip and low back pain. EXAM: DG HIP (WITH OR WITHOUT PELVIS) 2-3V RIGHT COMPARISON:  None. FINDINGS: An IVC filter is identified top study on the right. No fractures identified to explain the patient's symptoms. No dislocations. IMPRESSION: Negative. Electronically Signed   By: Gerome Sam III M.D   On: 01/03/2018 11:17    Procedures Procedures (including critical care time)  Medications Ordered in ED Medications  fentaNYL (SUBLIMAZE) injection 25 mcg (25 mcg Intravenous Given 01/03/18 1241)  oxyCODONE-acetaminophen (PERCOCET/ROXICET) 5-325 MG per tablet 1 tablet (1 tablet Oral Given 01/03/18 1431)     Initial Impression / Assessment and Plan / ED Course  I have reviewed the triage vital signs and the nursing notes.  Pertinent labs & imaging results that were available during my care of the patient were reviewed by me and considered in my medical decision making (see chart for details).  Clinical Course as of Jan 04 2112  Sat Jan 03, 2018  2052 IMPRESSION: Previous left hemilaminectomy and partial facetectomy at L4-5. Anterolisthesis of 7 mm at that level. Disc  degeneration more pronounced on the left with vacuum phenomenon. Left posterolateral to foraminal disc herniation, virtually certain to compress the exiting left L4 nerve, and with potential to affect the lower left-sided nerves as well.  L3-4 and L5-S1. Mild, non-compressive disc bulges and mild facet osteoarthritis.  CT Lumbar Spine Wo Contrast [CG]  2055 IMPRESSION: 1. No appreciable fracture. There is some mild sclerosis in the upper portion of the right femoral head which could be a subtle finding of avascular necrosis, but there is no hip effusion and no evidence of contour abnormality. 2. Left greater than right foraminal impingement at L4-5. See lumbar spine CT report for further assessment. 3. Remote prior right hamstring avulsion.  CT Hip Right Wo Contrast [CG]    Clinical Course User Index [CG] Liberty Handy, PA-C   Exam as above most suggestive of MSK etiology of back pain. Has h/o previous lumbar surgeries and chronic sciatica. On long term prednisone for ILD, higher risk for fx. Lower extremities grossly NVI. Distal pulses symmetric bilaterally, doubt dissection or vascular emergency.  UA with glucosuria but no signs of infection, no CVAT dout pyelo, kidney stone. She has no red flags to suggest cauda equina or epidural abscess.    CT w/o convincing fx, possible mild AVN and bilateral foraminal impingement at L4-5 which fits h/o sciatica and current physical findings. Pain adequately controlled in ED. She has ambulated at baseline. Feel pt safe for dc. Has adequate care at home and care giver can check in with her q4 hours. Encouraged adherance with prescribed percocet for adequate pain control. She is to f/u with PCP in 48 hours to ensure pain control  is adequate, may benefit from nsgy outpatient tx of chronic back pain. Discussed return precautions and plan to dc with pt, caregiver and daughter at bedside who are all agreeable. Pt discussed with Dr. Juleen China.    Final  Clinical Impressions(s) / ED Diagnoses   Final diagnoses:  Fall, subsequent encounter  Pain of right hip joint    ED Discharge Orders    None       Jerrell Mylar 01/03/18 2113    Raeford Razor, MD 01/05/18 1416

## 2018-01-03 NOTE — ED Triage Notes (Signed)
Caregiver stated, she fell about 10 days ago and now she is having right hip and back pain.

## 2018-01-03 NOTE — ED Triage Notes (Signed)
Pt. Stated, I fell 2 days ago because my blood sugar dropped in the morning . I hurt my right hip and my back.

## 2018-01-03 NOTE — ED Notes (Signed)
Pt in xray

## 2018-01-03 NOTE — ED Notes (Signed)
Patient transported to CT 

## 2018-01-03 NOTE — ED Notes (Signed)
Pt did not provide a urine sample PA informed.

## 2018-01-03 NOTE — Discharge Instructions (Signed)
CT scans showed no new injuries. Your urine did not look infected.  Continue percocet as prescribed, take a daily stool softener and miralax to prevent constipation. Can add [phone] mg acetaminophen (tylenol) for more pain control.   Follow up with your primary care doctor in 48 hours for re-evaluation of burning with urination and ensure pain control is adequate.   Return for fevers, blood in urine, worsening back pain, groin numbness, new numbness or weakness to your extremities, incontinence of bladder or bowels.

## 2018-01-03 NOTE — ED Triage Notes (Signed)
Went to doctor on Monday and they did not do xray's or nothing.

## 2018-01-04 LAB — URINE CULTURE: CULTURE: NO GROWTH

## 2018-01-05 ENCOUNTER — Other Ambulatory Visit: Payer: Self-pay | Admitting: Family Medicine

## 2018-01-05 NOTE — Telephone Encounter (Signed)
Last OV 4/1. Last Rx 3/11 #90. Pls advise

## 2018-01-11 ENCOUNTER — Other Ambulatory Visit: Payer: Self-pay | Admitting: Adult Health

## 2018-01-11 ENCOUNTER — Other Ambulatory Visit: Payer: Self-pay | Admitting: Internal Medicine

## 2018-01-12 NOTE — Telephone Encounter (Signed)
Last filled # 60 on 3/

## 2018-01-13 ENCOUNTER — Encounter: Payer: Self-pay | Admitting: Family Medicine

## 2018-01-13 ENCOUNTER — Telehealth: Payer: Self-pay | Admitting: Family Medicine

## 2018-01-13 ENCOUNTER — Other Ambulatory Visit: Payer: Self-pay | Admitting: Family Medicine

## 2018-01-13 DIAGNOSIS — Z794 Long term (current) use of insulin: Secondary | ICD-10-CM

## 2018-01-13 DIAGNOSIS — E1165 Type 2 diabetes mellitus with hyperglycemia: Secondary | ICD-10-CM

## 2018-01-13 DIAGNOSIS — J22 Unspecified acute lower respiratory infection: Secondary | ICD-10-CM

## 2018-01-13 DIAGNOSIS — E1140 Type 2 diabetes mellitus with diabetic neuropathy, unspecified: Secondary | ICD-10-CM

## 2018-01-13 DIAGNOSIS — J849 Interstitial pulmonary disease, unspecified: Secondary | ICD-10-CM

## 2018-01-13 DIAGNOSIS — R0902 Hypoxemia: Secondary | ICD-10-CM

## 2018-01-13 MED ORDER — AZITHROMYCIN 250 MG PO TABS: ORAL_TABLET | ORAL | 0 refills | Status: AC

## 2018-01-13 NOTE — Telephone Encounter (Signed)
Copied from CRM [phone]. Topic: Quick Communication - See Telephone Encounter >> Jan 13, 2018  9:43 AM Arlyss Gandyichardson, Indiyah Paone N, NT wrote: CRM for notification. See Telephone encounter for: 01/13/18. Pt requesting a call to discuss having palliative care for her mom. She wants to know the steps and process of possibly getting this set up.

## 2018-01-13 NOTE — Telephone Encounter (Signed)
I called and spoke with patient's daughter. See Mychart encounter for additional details.

## 2018-01-17 ENCOUNTER — Telehealth: Payer: Self-pay | Admitting: Family Medicine

## 2018-01-17 MED ORDER — MORPHINE SULFATE 20 MG/5ML PO SOLN: 0.5000 mg | ORAL | 0 refills | Status: AC | PRN

## 2018-01-17 NOTE — Telephone Encounter (Signed)
Hospice called about patient. Verbal orders for suction, atropine, haldol liquid, ativan and morphine. Morphine provided and faxed to pharmacy.   Myra RudeSchmitz, Jeremy E, MD [facility] 01/17/2018, 9:54 AM

## 2018-01-19 NOTE — Telephone Encounter (Signed)
PLEASE NOTE: All timestamps contained within this report are represented as [place] Standard Time. CONFIDENTIALTY NOTICE: This fax transmission is intended only for the addressee. It contains information that is legally privileged, confidential or otherwise protected from use or disclosure. If you are not the intended recipient, you are strictly prohibited from reviewing, disclosing, copying using or disseminating any of this information or taking any action in reliance on or regarding this information. If you have received this fax in error, please notify us immediately by telephone so that we can arrange for its return to us. Phone: [phone], Toll-Free: [phone], Fax: [phone] Page: 1 of 1 Call Id: [id number] [facility] - Client Nonclinical Telephone Record [facility] Client [facility] - [facility] - Night Physician Deboraha SprangGessner, Peggy Todd Contact Type Call Call Type Home Care Hospice Page Now Who Is Calling Home Health / Hospice Agency [facility] Number [phone] Patient Name Peggy Todd Patient DOB September 25, 1951 Reason for Call Request to speak to Physician Initial Comment Peggy [facility] Additional Comment Paging DoctorName Phone DateTime Result/Outcome Message Type Notes Clare GandySchmitz, Peggy Todd [phone] 01/17/2018 9:42:47 AM Called On Call Provider - Reached Doctor Paged Clare GandySchmitz, Peggy Todd 01/17/2018 9:42:51 AM Spoke with On Call - General Message Result Call Closed By: Rudi RummageAmy Todd Transaction Date/Time: 01/17/2018 9:30:44 AM (ET)

## 2018-01-21 ENCOUNTER — Telehealth: Payer: Self-pay

## 2018-01-21 NOTE — Telephone Encounter (Signed)
Patient date of death: 06/06/2018 at 9:59pm.    Deboraha Sprangebbie Gessner, NP out of the office.  Dr. Para Marchuncan reviewed and signed the death certificate.  Copy of certificate scanned in to Epic.  Funeral service: [employer] [facility] notified to pick up certificate.

## 2018-01-28 DEATH — deceased

## 2018-01-30 ENCOUNTER — Other Ambulatory Visit: Payer: Self-pay | Admitting: Family Medicine

## 2018-02-02 ENCOUNTER — Ambulatory Visit: Payer: Medicare Other | Admitting: Endocrinology

## 2018-02-09 ENCOUNTER — Ambulatory Visit: Payer: Medicare Other | Admitting: Internal Medicine

## 2018-02-28 DEATH — deceased

## 2018-03-12 ENCOUNTER — Other Ambulatory Visit: Payer: Self-pay | Admitting: Internal Medicine

## 2018-06-08 ENCOUNTER — Ambulatory Visit: Payer: Medicare Other

## 2018-06-15 ENCOUNTER — Ambulatory Visit: Payer: Medicare Other | Admitting: Family Medicine

## 2018-10-03 IMAGING — [place] PULMONARY VENT & PERF
16 series · 16 of 16 positions shown · non-contrast
Comparison: Chest x-ray 11/18/2016

CLINICAL DATA: Short of breath

EXAM:
NUCLEAR MEDICINE VENTILATION - PERFUSION LUNG SCAN
TECHNIQUE: Ventilation images were obtained in multiple projections using
inhaled aerosol Gc-77m DTPA. Perfusion images were obtained in
multiple projections after intravenous injection of Gc-77m MAA.
RADIOPHARMACEUTICALS:  32.7 mCi Dechnetium-QQm DTPA aerosol
inhalation and 4.4 mCi Dechnetium-QQm MAA IV

[Series 1: ant/post vent · 4.14mm/px · 1 of 1 slices shown (1 of 2)]
[im 1/1  full-range]
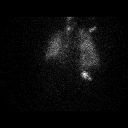

[Series 1: ant/post vent · 4.14mm/px · 1 of 1 slices shown (2 of 2)]
[im 1/1  full-range]
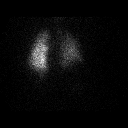

[Series 2: lao/rpo vent · 4.14mm/px · 1 of 1 slices shown (1 of 2)]
[im 1/1  full-range]
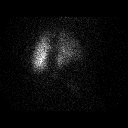

[Series 2: lao/rpo vent · 4.14mm/px · 1 of 1 slices shown (2 of 2)]
[im 1/1]
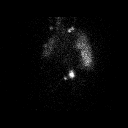

[Series 3: lpo/rao vent · 4.14mm/px · 1 of 1 slices shown (1 of 2)]
[im 1/1  full-range]
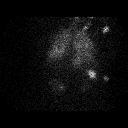

[Series 3: lpo/rao vent · 4.14mm/px · 1 of 1 slices shown (2 of 2)]
[im 1/1  full-range]
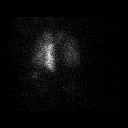

[Series 4: lt lat/rt lat vent · 4.14mm/px · 1 of 1 slices shown (1 of 2)]
[im 1/1  full-range]
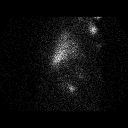

[Series 4: lt lat/rt lat vent · 4.14mm/px · 1 of 1 slices shown (2 of 2)]
[im 1/1  full-range]
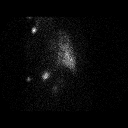

[Series 5: lt lat/rt lat perf · 4.14mm/px · 1 of 1 slices shown (1 of 2)]
[im 1/1]
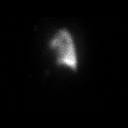

[Series 5: lt lat/rt lat perf · 4.14mm/px · 1 of 1 slices shown (2 of 2)]
[im 1/1]
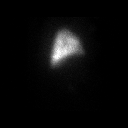

[Series 6: lpo/rao perf · 4.14mm/px · 1 of 1 slices shown (1 of 2)]
[im 1/1]
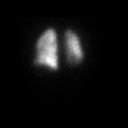

[Series 6: lpo/rao perf · 4.14mm/px · 1 of 1 slices shown (2 of 2)]
[im 1/1]
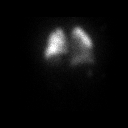

[Series 7: ant/post perf · 4.14mm/px · 1 of 1 slices shown (1 of 2)]
[im 1/1]
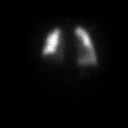

[Series 7: ant/post perf · 4.14mm/px · 1 of 1 slices shown (2 of 2)]
[im 1/1]
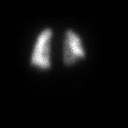

[Series 8: lao/rpo perf · 4.14mm/px · 1 of 1 slices shown (1 of 2)]
[im 1/1]
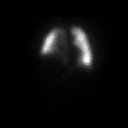

[Series 8: lao/rpo perf · 4.14mm/px · 1 of 1 slices shown (2 of 2)]
[im 1/1]
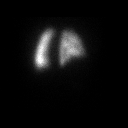

[16 of 16 positions shown; findings below may reference images not displayed]

FINDINGS: Ventilation: No focal ventilation defect.

Perfusion: No wedge shaped peripheral perfusion defects to suggest
acute pulmonary embolism.
IMPRESSION: Low probability for pulmonary emboli.
